# Patient Record
Sex: Male | Born: 1980 | Race: White | Hispanic: No | Marital: Married | Smoking: Never smoker
Health system: Southern US, Community
[De-identification: ages and names within clinical notes are randomized; demographics above are authoritative.]

## PROBLEM LIST (undated history)

## (undated) DIAGNOSIS — R7301 Impaired fasting glucose: Secondary | ICD-10-CM

## (undated) DIAGNOSIS — E8881 Metabolic syndrome: Secondary | ICD-10-CM

## (undated) DIAGNOSIS — E785 Hyperlipidemia, unspecified: Secondary | ICD-10-CM

## (undated) DIAGNOSIS — E668 Other obesity: Secondary | ICD-10-CM

## (undated) DIAGNOSIS — M109 Gout, unspecified: Secondary | ICD-10-CM

## (undated) DIAGNOSIS — E669 Obesity, unspecified: Secondary | ICD-10-CM

## (undated) DIAGNOSIS — I10 Essential (primary) hypertension: Secondary | ICD-10-CM

## (undated) HISTORY — DX: Gout, unspecified: M10.9

## (undated) HISTORY — DX: Metabolic syndrome: E88.810

## (undated) HISTORY — DX: Essential (primary) hypertension: I10

## (undated) HISTORY — DX: Other obesity: E66.8

## (undated) HISTORY — DX: Hyperlipidemia, unspecified: E78.5

## (undated) HISTORY — DX: Obesity, unspecified: E66.9

## (undated) HISTORY — DX: Impaired fasting glucose: R73.01

## (undated) HISTORY — DX: Metabolic syndrome: E88.81

---

## 2004-12-29 ENCOUNTER — Ambulatory Visit: Payer: Self-pay | Admitting: Family Medicine

## 2013-08-21 DIAGNOSIS — Z86010 Personal history of colonic polyps: Secondary | ICD-10-CM | POA: Insufficient documentation

## 2013-10-16 ENCOUNTER — Ambulatory Visit: Payer: Self-pay | Admitting: Unknown Physician Specialty

## 2013-10-20 LAB — PATHOLOGY REPORT

## 2014-09-22 ENCOUNTER — Other Ambulatory Visit: Payer: Self-pay

## 2014-09-22 DIAGNOSIS — I10 Essential (primary) hypertension: Secondary | ICD-10-CM

## 2014-09-22 DIAGNOSIS — Z5181 Encounter for therapeutic drug level monitoring: Secondary | ICD-10-CM

## 2014-09-22 MED ORDER — LISINOPRIL 20 MG PO TABS: 20.0000 mg | ORAL_TABLET | Freq: Every day | ORAL | Status: DC

## 2014-09-22 NOTE — Telephone Encounter (Signed)
Patient was last seen on 03/15/14 and that note said follow-up in 5 months so patient probably needs to be scheduled an appointment. [facility] Partner number is [phone].

## 2014-09-22 NOTE — Telephone Encounter (Signed)
[facility] reviewed; creatinine Dec 2015 1.22, K+ 4.1 Lisinopril was increased from 10 mg to 20 mg that visit I do not see a repeat creatinine or K+; patient did not return for labs and visit in May as directed -------------------- [place] -- remind the patient Brian MaxwellCheryl wanted to see him back in May; get him scheduled to see her as soon as possible Please have the patient come in for a BMP only on Thursday July 7th (so I can get the results back by Friday) I'll approve a few days of medicine, but want to make sure his kidneys are happy with the higher dose of lisinopril I'm not getting all the labs Goshenheryl wanted, just the one necessary to make sure it's safe to continue this pill

## 2014-09-23 ENCOUNTER — Other Ambulatory Visit: Payer: BLUE CROSS/BLUE SHIELD

## 2014-09-23 DIAGNOSIS — I10 Essential (primary) hypertension: Secondary | ICD-10-CM

## 2014-09-23 DIAGNOSIS — Z5181 Encounter for therapeutic drug level monitoring: Secondary | ICD-10-CM

## 2014-09-23 NOTE — Telephone Encounter (Signed)
Called and left patient a voicemail stating that we needed to schedule a visit for him with Elnita Maxwellheryl and a lab visit. I asked for him to return my call.

## 2014-09-23 NOTE — Telephone Encounter (Signed)
Patient returned call and spoke to BuchananStephanie. She scheduled him a lab visit for today and patient stated he would schedule the follow-up visit when he came in for the lab today.

## 2014-09-24 ENCOUNTER — Other Ambulatory Visit: Payer: Self-pay | Admitting: Family Medicine

## 2014-09-24 DIAGNOSIS — I10 Essential (primary) hypertension: Secondary | ICD-10-CM

## 2014-09-24 DIAGNOSIS — E1169 Type 2 diabetes mellitus with other specified complication: Secondary | ICD-10-CM | POA: Insufficient documentation

## 2014-09-24 DIAGNOSIS — I152 Hypertension secondary to endocrine disorders: Secondary | ICD-10-CM | POA: Insufficient documentation

## 2014-09-24 DIAGNOSIS — E1159 Type 2 diabetes mellitus with other circulatory complications: Secondary | ICD-10-CM | POA: Insufficient documentation

## 2014-09-24 DIAGNOSIS — R7301 Impaired fasting glucose: Secondary | ICD-10-CM

## 2014-09-24 DIAGNOSIS — M109 Gout, unspecified: Secondary | ICD-10-CM | POA: Insufficient documentation

## 2014-09-24 DIAGNOSIS — E669 Obesity, unspecified: Secondary | ICD-10-CM | POA: Insufficient documentation

## 2014-09-24 DIAGNOSIS — E8881 Metabolic syndrome: Secondary | ICD-10-CM

## 2014-09-24 DIAGNOSIS — E790 Hyperuricemia without signs of inflammatory arthritis and tophaceous disease: Secondary | ICD-10-CM

## 2014-09-24 DIAGNOSIS — E668 Other obesity: Secondary | ICD-10-CM

## 2014-09-24 DIAGNOSIS — I129 Hypertensive chronic kidney disease with stage 1 through stage 4 chronic kidney disease, or unspecified chronic kidney disease: Secondary | ICD-10-CM

## 2014-09-24 DIAGNOSIS — N181 Chronic kidney disease, stage 1: Secondary | ICD-10-CM

## 2014-09-24 DIAGNOSIS — E119 Type 2 diabetes mellitus without complications: Secondary | ICD-10-CM | POA: Insufficient documentation

## 2014-09-24 DIAGNOSIS — E785 Hyperlipidemia, unspecified: Secondary | ICD-10-CM

## 2014-09-24 DIAGNOSIS — Z5181 Encounter for therapeutic drug level monitoring: Secondary | ICD-10-CM

## 2014-09-24 LAB — BASIC METABOLIC PANEL
BUN / CREAT RATIO: 13 (ref 8–19)
BUN: 14 mg/dL (ref 6–20)
CHLORIDE: 99 mmol/L (ref 97–108)
CO2: 24 mmol/L (ref 18–29)
Calcium: 9.4 mg/dL (ref 8.7–10.2)
Creatinine, Ser: 1.06 mg/dL (ref 0.76–1.27)
GFR calc Af Amer: 106 mL/min/{1.73_m2} (ref >59)
GFR calc non Af Amer: 92 mL/min/{1.73_m2} (ref >59)
Glucose: 113 mg/dL — ABNORMAL HIGH (ref 65–99)
Potassium: 4 mmol/L (ref 3.5–5.2)
SODIUM: 141 mmol/L (ref 134–144)

## 2014-09-24 MED ORDER — LISINOPRIL 20 MG PO TABS: 20.0000 mg | ORAL_TABLET | Freq: Every day | ORAL | Status: DC

## 2014-09-24 NOTE — Progress Notes (Signed)
Rx sent for BP after reviewing the BMP We don't have a pharmacy listed; printed Rx to be manually faxed

## 2014-09-24 NOTE — Progress Notes (Signed)
[facility]

## 2014-10-01 ENCOUNTER — Encounter: Payer: Self-pay | Admitting: Unknown Physician Specialty

## 2014-10-01 ENCOUNTER — Ambulatory Visit (INDEPENDENT_AMBULATORY_CARE_PROVIDER_SITE_OTHER): Payer: BLUE CROSS/BLUE SHIELD | Admitting: Unknown Physician Specialty

## 2014-10-01 VITALS — BP 149/89 | HR 81 | Temp 98.0°F | Ht 73.2 in | Wt 375.6 lb

## 2014-10-01 DIAGNOSIS — N181 Chronic kidney disease, stage 1: Secondary | ICD-10-CM | POA: Diagnosis not present

## 2014-10-01 DIAGNOSIS — E669 Obesity, unspecified: Secondary | ICD-10-CM

## 2014-10-01 DIAGNOSIS — E668 Other obesity: Secondary | ICD-10-CM

## 2014-10-01 DIAGNOSIS — I129 Hypertensive chronic kidney disease with stage 1 through stage 4 chronic kidney disease, or unspecified chronic kidney disease: Secondary | ICD-10-CM

## 2014-10-01 NOTE — Patient Instructions (Signed)

## 2014-10-01 NOTE — Assessment & Plan Note (Addendum)
Pt agrees to stop soft drinks.  Consider sleep study

## 2014-10-01 NOTE — Assessment & Plan Note (Signed)
Elevated today but has been at goal.  Will recheck in December for PE.

## 2014-10-01 NOTE — Progress Notes (Signed)
kj  BP 149/89 mmHg  Pulse 81  Temp(Src) 98 F (36.7 C)  Ht 6' 1.2" (1.859 m)  Wt 375 lb 9.6 oz (170.371 kg)  BMI 49.30 kg/m2  SpO2 97%   Subjective:    Patient ID: Brian Thornton, male    DOB: November 11, 1980, 34 y.o.   MRN: [id number]  HPI: Brian Ravenhillip G Rubinstein is a 34 y.o. male  Chief Complaint  Patient presents with  . Hypertension    Relevant past medical, surgical, family and social history reviewed and updated as indicated. Interim medical history since our last visit reviewed. Allergies and medications reviewed and updated.  Hypertension This is a chronic problem. Progression since onset: Not to goal. Pertinent negatives include no anxiety, chest pain, peripheral edema or shortness of breath. Risk factors for coronary artery disease include obesity. There are no compliance problems.  Hypertensive end-organ damage includes kidney disease.    Gout Per Dr. [facility]  Obesity Severely obese.  Continues to gain weight.  Snores at night.  Doesn't fall asleep in the middle of the day.  Unable to exercise due to work schedule.  States he overeats when he gets home.  Drinks 4-5 soft drinks/day.     Review of Systems  Respiratory: Negative for shortness of breath.   Cardiovascular: Negative for chest pain.  All other systems reviewed and are negative.   Per HPI unless specifically indicated above     Objective:    BP 149/89 mmHg  Pulse 81  Temp(Src) 98 F (36.7 C)  Ht 6' 1.2" (1.859 m)  Wt 375 lb 9.6 oz (170.371 kg)  BMI 49.30 kg/m2  SpO2 97%  Wt Readings from Last 3 Encounters:  10/01/14 375 lb 9.6 oz (170.371 kg)  03/15/14 370 lb (167.831 kg)    Physical Exam  Constitutional: He is oriented to person, place, and time. He appears well-developed and well-nourished. No distress.  HENT:  Head: Normocephalic and atraumatic.  Eyes: Conjunctivae and lids are normal. Right eye exhibits no discharge. Left eye exhibits no discharge. No scleral icterus.  Cardiovascular:  Normal rate and regular rhythm.   Pulmonary/Chest: Effort normal. No respiratory distress.  Abdominal: Normal appearance and bowel sounds are normal. There is no splenomegaly or hepatomegaly.  Musculoskeletal: Normal range of motion.  Neurological: He is alert and oriented to person, place, and time.  Skin: Skin is intact. No rash noted. No pallor.  Psychiatric: He has a normal mood and affect. His behavior is normal. Judgment and thought content normal.  Vitals reviewed.      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  Will  recheck in December for PE.          Other   Extreme obesity    Pt agrees to stop soft drinks.  Consider sleep study          Follow up plan: Return in about 5 months (around 03/03/2015) for Physical in December.  Marland Kitchen

## 2014-11-24 ENCOUNTER — Other Ambulatory Visit: Payer: Self-pay

## 2014-11-24 MED ORDER — INDOMETHACIN 50 MG PO CAPS: 50.0000 mg | ORAL_CAPSULE | Freq: Three times a day (TID) | ORAL | Status: DC | PRN

## 2014-11-24 NOTE — Telephone Encounter (Signed)
Patient was last seen 10/01/14 and [facility]. 

## 2015-01-14 ENCOUNTER — Other Ambulatory Visit: Payer: Self-pay

## 2015-01-14 DIAGNOSIS — I10 Essential (primary) hypertension: Secondary | ICD-10-CM

## 2015-01-14 DIAGNOSIS — Z5181 Encounter for therapeutic drug level monitoring: Secondary | ICD-10-CM

## 2015-01-14 MED ORDER — LISINOPRIL 20 MG PO TABS: 20.0000 mg | ORAL_TABLET | Freq: Every day | ORAL | Status: DC

## 2015-01-14 NOTE — Telephone Encounter (Signed)
LAST VISIT: 10/01/2014 PATIENT HAS UPCOMING APPOINTMENT 02/21/2015  Request for lisinorpil 20 mg tab.  Dr. Sherie DonLada wrote the Rx 09/24/2014 #30 with 1 refill.

## 2015-01-17 ENCOUNTER — Other Ambulatory Visit: Payer: Self-pay | Admitting: Unknown Physician Specialty

## 2015-01-17 ENCOUNTER — Other Ambulatory Visit: Payer: Self-pay | Admitting: Family Medicine

## 2015-02-21 ENCOUNTER — Encounter: Payer: Self-pay | Admitting: Unknown Physician Specialty

## 2015-02-21 ENCOUNTER — Ambulatory Visit (INDEPENDENT_AMBULATORY_CARE_PROVIDER_SITE_OTHER): Payer: BLUE CROSS/BLUE SHIELD | Admitting: Unknown Physician Specialty

## 2015-02-21 VITALS — BP 134/84 | HR 73 | Temp 98.6°F | Ht 72.9 in | Wt 363.4 lb

## 2015-02-21 DIAGNOSIS — M109 Gout, unspecified: Secondary | ICD-10-CM

## 2015-02-21 DIAGNOSIS — Z5181 Encounter for therapeutic drug level monitoring: Secondary | ICD-10-CM

## 2015-02-21 DIAGNOSIS — R7301 Impaired fasting glucose: Secondary | ICD-10-CM | POA: Diagnosis not present

## 2015-02-21 DIAGNOSIS — I10 Essential (primary) hypertension: Secondary | ICD-10-CM

## 2015-02-21 DIAGNOSIS — Z0000 Encounter for general adult medical examination without abnormal findings: Secondary | ICD-10-CM | POA: Diagnosis not present

## 2015-02-21 DIAGNOSIS — Z800 Family history of malignant neoplasm of digestive organs: Secondary | ICD-10-CM | POA: Diagnosis not present

## 2015-02-21 LAB — BAYER DCA HB A1C WAIVED: HB A1C: 5.5 % (ref <7.0)

## 2015-02-21 LAB — MICROALBUMIN, URINE WAIVED
Creatinine, Urine Waived: 200 mg/dL (ref 10–300)
Microalb, Ur Waived: 80 mg/L — ABNORMAL HIGH (ref 0–19)
Microalb/Creat Ratio: <30 mg/g (ref <30)

## 2015-02-21 MED ORDER — LISINOPRIL 20 MG PO TABS: 20.0000 mg | ORAL_TABLET | Freq: Every day | ORAL | Status: DC

## 2015-02-21 NOTE — Assessment & Plan Note (Signed)
Lost 12 pounds by stopping soft drinks

## 2015-02-21 NOTE — Progress Notes (Signed)
BP 134/84 mmHg  Pulse 73  Temp(Src) 98.6 F (37 C)  Ht 6' 0.9" (1.852 m)  Wt 363 lb 6.4 oz (164.837 kg)  BMI 48.06 kg/m2  SpO2 97%   Subjective:    Patient ID: Brian Thornton, male    DOB: Sep 19, 1980, 34 y.o.   MRN: [id number]  HPI: Brian Ravenhillip G Phegley is a 34 y.o. male  Chief Complaint  Patient presents with  . Annual Exam   Hypertension Using medications without difficulty Average home BPs not checking   No problems or lightheadedness No chest pain with exertion or shortness of breath No Edema  Sleep apnea Brother has sleep apnea.  Pt snores loudly at night.  Wakes us feeling rested.  Does not wake up with a headache.  Does fall asleep during the day at times.   Relevant past medical, surgical, family and social history reviewed and updated as indicated. Interim medical history since our last visit reviewed. Allergies and medications reviewed and updated.  Review of Systems  Constitutional: Negative.   HENT: Negative.   Eyes: Negative.   Respiratory: Negative.   Cardiovascular: Negative.   Gastrointestinal: Negative.   Endocrine: Negative.   Genitourinary: Negative.   Skin: Negative.   Allergic/Immunologic: Negative.   Neurological: Negative.   Hematological: Negative.   Psychiatric/Behavioral: Negative.     Per HPI unless specifically indicated above     Objective:    BP 134/84 mmHg  Pulse 73  Temp(Src) 98.6 F (37 C)  Ht 6' 0.9" (1.852 m)  Wt 363 lb 6.4 oz (164.837 kg)  BMI 48.06 kg/m2  SpO2 97%  Wt Readings from Last 3 Encounters:  02/21/15 363 lb 6.4 oz (164.837 kg)  10/01/14 375 lb 9.6 oz (170.371 kg)  03/15/14 370 lb (167.831 kg)    Physical Exam  Constitutional: He is oriented to person, place, and time. He appears well-developed and well-nourished.  HENT:  Head: Normocephalic.  Eyes: Pupils are equal, round, and reactive to light.  Cardiovascular: Normal rate, regular rhythm and normal heart sounds.   Pulmonary/Chest: Effort normal.   Abdominal: Soft. Bowel sounds are normal.  Musculoskeletal: Normal range of motion.  Neurological: He is alert and oriented to person, place, and time. He has normal reflexes.  Skin: Skin is warm and dry.  Psychiatric: He has a normal mood and affect. His behavior is normal. Judgment and thought content normal.     Assessment & Plan:   Problem List Items Addressed This Visit      Unprioritized   Hypertension   Relevant Medications   lisinopril (PRINIVIL,ZESTRIL) 20 MG tablet   Other Relevant Orders   Lipid Panel w/o Chol/HDL Ratio   Microalbumin, Urine Waived   Uric acid   Gout   Relevant Orders   Uric acid   IFG (impaired fasting glucose)   Relevant Orders   Bayer DCA Hb A1c Waived   Extreme obesity (HCC)    Lost 12 pounds by stopping soft drinks      Relevant Orders   Lipid Panel w/o Chol/HDL Ratio   Ambulatory referral to Sleep Studies   Family history of colon cancer - Primary    Other Visit Diagnoses    Routine general medical examination at a health care facility        Relevant Orders    HIV antibody    Comprehensive metabolic panel    CBC    TSH    Lipid Panel w/o Chol/HDL Ratio    Medication monitoring encounter  Relevant Medications    lisinopril (PRINIVIL,ZESTRIL) 20 MG tablet        Follow up plan: Return in about 6 months (around 08/22/2015).

## 2015-02-22 ENCOUNTER — Encounter: Payer: Self-pay | Admitting: Unknown Physician Specialty

## 2015-02-22 LAB — COMPREHENSIVE METABOLIC PANEL
A/G RATIO: 1.7 (ref 1.1–2.5)
ALBUMIN: 4.2 g/dL (ref 3.5–5.5)
ALT: 34 IU/L (ref 0–44)
AST: 23 IU/L (ref 0–40)
Alkaline Phosphatase: 90 IU/L (ref 39–117)
BUN/Creatinine Ratio: 10 (ref 8–19)
BUN: 11 mg/dL (ref 6–20)
Bilirubin Total: 0.3 mg/dL (ref 0.0–1.2)
CALCIUM: 9.5 mg/dL (ref 8.7–10.2)
CO2: 24 mmol/L (ref 18–29)
Chloride: 103 mmol/L (ref 97–106)
Creatinine, Ser: 1.09 mg/dL (ref 0.76–1.27)
GFR, EST AFRICAN AMERICAN: 103 mL/min/{1.73_m2} (ref >59)
GFR, EST NON AFRICAN AMERICAN: 89 mL/min/{1.73_m2} (ref >59)
Globulin, Total: 2.5 g/dL (ref 1.5–4.5)
Glucose: 104 mg/dL — ABNORMAL HIGH (ref 65–99)
POTASSIUM: 4.3 mmol/L (ref 3.5–5.2)
Sodium: 142 mmol/L (ref 136–144)
TOTAL PROTEIN: 6.7 g/dL (ref 6.0–8.5)

## 2015-02-22 LAB — TSH: TSH: 1.48 u[IU]/mL (ref 0.450–4.500)

## 2015-02-22 LAB — CBC
HEMATOCRIT: 44.8 % (ref 37.5–51.0)
HEMOGLOBIN: 15.3 g/dL (ref 12.6–17.7)
MCH: 29 pg (ref 26.6–33.0)
MCHC: 34.2 g/dL (ref 31.5–35.7)
MCV: 85 fL (ref 79–97)
Platelets: 215 10*3/uL (ref 150–379)
RBC: 5.27 x10E6/uL (ref 4.14–5.80)
RDW: 14.6 % (ref 12.3–15.4)
WBC: 8.7 10*3/uL (ref 3.4–10.8)

## 2015-02-22 LAB — LIPID PANEL W/O CHOL/HDL RATIO
Cholesterol, Total: 171 mg/dL (ref 100–199)
HDL: 30 mg/dL — ABNORMAL LOW (ref >39)
LDL CALC: 112 mg/dL — AB (ref 0–99)
Triglycerides: 143 mg/dL (ref 0–149)
VLDL CHOLESTEROL CAL: 29 mg/dL (ref 5–40)

## 2015-02-22 LAB — URIC ACID: Uric Acid: 7.1 mg/dL (ref 3.7–8.6)

## 2015-02-22 LAB — HIV ANTIBODY (ROUTINE TESTING W REFLEX): HIV SCREEN 4TH GENERATION: NONREACTIVE

## 2015-02-22 NOTE — Progress Notes (Signed)
Quick Note:  Normal labs. Patient notified by letter. ______ 

## 2015-04-07 ENCOUNTER — Other Ambulatory Visit: Payer: Self-pay | Admitting: Family Medicine

## 2015-05-10 ENCOUNTER — Other Ambulatory Visit: Payer: Self-pay | Admitting: Unknown Physician Specialty

## 2015-08-19 ENCOUNTER — Other Ambulatory Visit: Payer: Self-pay | Admitting: Unknown Physician Specialty

## 2015-09-06 ENCOUNTER — Telehealth: Payer: Self-pay | Admitting: Unknown Physician Specialty

## 2015-09-06 NOTE — Telephone Encounter (Signed)
Pt called and stated that he has had a headache for about 3 days. He has an appt scheduled for 09/07/2015 but would like to know if he should take more BP meds or if there is something he can take to help.

## 2015-09-06 NOTE — Telephone Encounter (Signed)
Called and gave patient Brian Thornton's instructions.  

## 2015-09-06 NOTE — Telephone Encounter (Signed)
Called and left patient a voicemail asking for him to please return my call.  

## 2015-09-06 NOTE — Telephone Encounter (Signed)
Called and spoke to patient. He stated that he has tried taking aleve and some allergy medication to see if they would help and they didn't. I asked the patient if he had checked his BP at all during the headache and he stated that it was 148/89 last night.

## 2015-09-06 NOTE — Telephone Encounter (Signed)
I think I would need to see him.  If the headache is severe, or any neuological changes he should go to the ER.  It will be OK to double his BP medication.

## 2015-09-07 ENCOUNTER — Ambulatory Visit (INDEPENDENT_AMBULATORY_CARE_PROVIDER_SITE_OTHER): Payer: Self-pay | Admitting: Unknown Physician Specialty

## 2015-09-07 ENCOUNTER — Encounter: Payer: Self-pay | Admitting: Unknown Physician Specialty

## 2015-09-07 VITALS — BP 121/77 | HR 74 | Temp 98.2°F | Ht 73.1 in | Wt 376.0 lb

## 2015-09-07 DIAGNOSIS — G4730 Sleep apnea, unspecified: Secondary | ICD-10-CM

## 2015-09-07 DIAGNOSIS — I10 Essential (primary) hypertension: Secondary | ICD-10-CM

## 2015-09-07 MED ORDER — LISINOPRIL 40 MG PO TABS: 40.0000 mg | ORAL_TABLET | Freq: Every day | ORAL | Status: DC

## 2015-09-07 NOTE — Assessment & Plan Note (Signed)
Suspect headache is related to sleep apnea.  He is uninsured but really needs sleep study.

## 2015-09-07 NOTE — Progress Notes (Signed)
BP 121/77 mmHg  Pulse 74  Temp(Src) 98.2 F (36.8 C)  Ht 6' 1.1" (1.857 m)  Wt 376 lb (170.552 kg)  BMI 49.46 kg/m2  SpO2 96%   Subjective:    Patient ID: Brian Thornton, male    DOB: [birth year], 35 y.o.   MRN: [id number]  HPI: Brian Thornton is a 35 y.o. male  Chief Complaint  Patient presents with  . Hypertension    pt states his BP has been elevated, did take 2 lisinopril tablets this morning  . Headache    pt states he has had a headache for about 4 days now, states it is better now than it has been    Headache Pt has a lingering headache across his forehead, across his temples and down his neck.  He took an Exedrine today which helped.  His BP 2 days ago was 146-148/89.  No trouble speaking or problems with arm, legs, feet, or hands.  His headache was really bad at the time.  He denies additional stress.  He snores at night and has apneic episodes.    Relevant past medical, surgical, family and social history reviewed and updated as indicated. Interim medical history since our last visit reviewed. Allergies and medications reviewed and updated.  Review of Systems  Per HPI unless specifically indicated above     Objective:    BP 121/77 mmHg  Pulse 74  Temp(Src) 98.2 F (36.8 C)  Ht 6' 1.1" (1.857 m)  Wt 376 lb (170.552 kg)  BMI 49.46 kg/m2  SpO2 96%  Wt Readings from Last 3 Encounters:  09/07/15 376 lb (170.552 kg)  02/21/15 363 lb 6.4 oz (164.837 kg)  10/01/14 375 lb 9.6 oz (170.371 kg)    Physical Exam  Constitutional: He is oriented to person, place, and time. He appears well-developed and well-nourished. No distress.  HENT:  Head: Normocephalic and atraumatic.  Eyes: Conjunctivae and lids are normal. Right eye exhibits no discharge. Left eye exhibits no discharge. No scleral icterus.  Neck: Normal range of motion. Neck supple. No JVD present. Carotid bruit is not present.  Cardiovascular: Normal rate, regular rhythm and normal heart sounds.    Pulmonary/Chest: Effort normal and breath sounds normal. No respiratory distress.  Abdominal: Normal appearance. There is no splenomegaly or hepatomegaly.  Musculoskeletal: Normal range of motion.  Neurological: He is alert and oriented to person, place, and time.  Skin: Skin is warm, dry and intact. No rash noted. No pallor.  Psychiatric: He has a normal mood and affect. His behavior is normal. Judgment and thought content normal.    Results for orders placed or performed in visit on 02/21/15  HIV antibody  Result Value Ref Range   HIV Screen 4th Generation wRfx Non Reactive Non Reactive  Comprehensive metabolic panel  Result Value Ref Range   Glucose 104 (H) 65 - 99 mg/dL   BUN 11 6 - 20 mg/dL   Creatinine, Ser 0.86 0.76 - 1.27 mg/dL   GFR calc non Af Amer 89 >59 mL/min/1.73   GFR calc Af Amer 103 >59 mL/min/1.73   BUN/Creatinine Ratio 10 8 - 19   Sodium 142 136 - 144 mmol/L   Potassium 4.3 3.5 - 5.2 mmol/L   Chloride 103 97 - 106 mmol/L   CO2 24 18 - 29 mmol/L   Calcium 9.5 8.7 - 10.2 mg/dL   Total Protein 6.7 6.0 - 8.5 g/dL   Albumin 4.2 3.5 - 5.5 g/dL   Globulin, Total  2.5 1.5 - 4.5 g/dL   Albumin/Globulin Ratio 1.7 1.1 - 2.5   Bilirubin Total 0.3 0.0 - 1.2 mg/dL   Alkaline Phosphatase 90 39 - 117 IU/L   AST 23 0 - 40 IU/L   ALT 34 0 - 44 IU/L  CBC  Result Value Ref Range   WBC 8.7 3.4 - 10.8 x10E3/uL   RBC 5.27 4.14 - 5.80 x10E6/uL   Hemoglobin 15.3 12.6 - 17.7 g/dL   Hematocrit 78.244.8 95.637.5 - 51.0 %   MCV 85 79 - 97 fL   MCH 29.0 26.6 - 33.0 pg   MCHC 34.2 31.5 - 35.7 g/dL   RDW 21.314.6 08.612.3 - 57.815.4 %   Platelets 215 150 - 379 x10E3/uL  TSH  Result Value Ref Range   TSH 1.480 0.450 - 4.500 uIU/mL  Lipid Panel w/o Chol/HDL Ratio  Result Value Ref Range   Cholesterol, Total 171 100 - 199 mg/dL   Triglycerides 469143 0 - 149 mg/dL   HDL 30 (L) >62>39 mg/dL   VLDL Cholesterol Cal 29 5 - 40 mg/dL   LDL Calculated 952112 (H) 0 - 99 mg/dL  Microalbumin, Urine Waived  Result  Value Ref Range   Microalb, Ur Waived 80 (H) 0 - 19 mg/L   Creatinine, Urine Waived 200 10 - 300 mg/dL   Microalb/Creat Ratio <30 <30 mg/g  Uric acid  Result Value Ref Range   Uric Acid 7.1 3.7 - 8.6 mg/dL  Bayer DCA Hb W4XA1c Waived  Result Value Ref Range   Bayer DCA Hb A1c Waived 5.5 <7.0 %      Assessment & Plan:   Problem List Items Addressed This Visit      Unprioritized   Extreme obesity (HCC)    Had lost weight and now gained it back      Hypertension - Primary    Normotensive today.  Took 40 mg Lisinopril today.  Continue with this dose      Relevant Medications   lisinopril (PRINIVIL,ZESTRIL) 40 MG tablet   Sleep apnea    Suspect headache is related to sleep apnea.  He is uninsured but really needs sleep study.        Relevant Orders   Ambulatory referral to Sleep Studies       Follow up plan: Return in about 6 weeks (around 10/19/2015).

## 2015-09-07 NOTE — Assessment & Plan Note (Addendum)
Normotensive today.  Took 40 mg Lisinopril today.  Continue with this dose

## 2015-09-07 NOTE — Assessment & Plan Note (Signed)
Had lost weight and now gained it back

## 2015-10-19 ENCOUNTER — Ambulatory Visit (INDEPENDENT_AMBULATORY_CARE_PROVIDER_SITE_OTHER): Payer: Self-pay | Admitting: Unknown Physician Specialty

## 2015-10-19 ENCOUNTER — Encounter: Payer: Self-pay | Admitting: Unknown Physician Specialty

## 2015-10-19 VITALS — BP 132/82 | HR 75 | Temp 98.3°F | Ht 72.6 in | Wt 384.8 lb

## 2015-10-19 DIAGNOSIS — I10 Essential (primary) hypertension: Secondary | ICD-10-CM

## 2015-10-19 DIAGNOSIS — M109 Gout, unspecified: Secondary | ICD-10-CM

## 2015-10-19 DIAGNOSIS — R7301 Impaired fasting glucose: Secondary | ICD-10-CM

## 2015-10-19 DIAGNOSIS — E8881 Metabolic syndrome: Secondary | ICD-10-CM

## 2015-10-19 LAB — BAYER DCA HB A1C WAIVED: HB A1C (BAYER DCA - WAIVED): 6.4 % (ref <7.0)

## 2015-10-19 NOTE — Assessment & Plan Note (Signed)
Discussed cutting out all sugar drinks

## 2015-10-19 NOTE — Assessment & Plan Note (Signed)
Stable, continue present medications.   

## 2015-10-19 NOTE — Assessment & Plan Note (Signed)
Hgb A1C is 6.4.  Pt ed on diet

## 2015-10-19 NOTE — Progress Notes (Signed)
BP 132/82 (BP Location: Left Arm, Patient Position: Sitting, Cuff Size: Large)   Pulse 75   Temp 98.3 F (36.8 C)   Ht 6' 0.6" (1.844 m) Comment: pt had boots on  Wt (!) 384 lb 12.8 oz (174.5 kg) Comment: pt had boots on  SpO2 98%   BMI 51.33 kg/m    Subjective:    Patient ID: Brian Thornton, male    DOB: October 22, 1980, 35 y.o.   MRN: [id number]  HPI: Brian Thornton is a 35 y.o. male  Chief Complaint  Patient presents with  . Hypertension   Hypertension Using medications without difficulty Average home BPs   No problems or lightheadedness No chest pain with exertion or shortness of breath No Edema  Obesity "I haven't done much" Awaiting sleep apnea tests.    Relevant past medical, surgical, family and social history reviewed and updated as indicated. Interim medical history since our last visit reviewed. Allergies and medications reviewed and updated.  Review of Systems  Per HPI unless specifically indicated above     Objective:    BP 132/82 (BP Location: Left Arm, Patient Position: Sitting, Cuff Size: Large)   Pulse 75   Temp 98.3 F (36.8 C)   Ht 6' 0.6" (1.844 m) Comment: pt had boots on  Wt (!) 384 lb 12.8 oz (174.5 kg) Comment: pt had boots on  SpO2 98%   BMI 51.33 kg/m   Wt Readings from Last 3 Encounters:  10/19/15 (!) 384 lb 12.8 oz (174.5 kg)  09/07/15 (!) 376 lb (170.6 kg)  02/21/15 (!) 363 lb 6.4 oz (164.8 kg)    Physical Exam  Constitutional: He is oriented to person, place, and time. He appears well-developed and well-nourished. No distress.  HENT:  Head: Normocephalic and atraumatic.  Eyes: Conjunctivae and lids are normal. Right eye exhibits no discharge. Left eye exhibits no discharge. No scleral icterus.  Neck: Normal range of motion. Neck supple. No JVD present. Carotid bruit is not present.  Cardiovascular: Normal rate, regular rhythm and normal heart sounds.   Pulmonary/Chest: Effort normal and breath sounds normal. No respiratory  distress.  Abdominal: Normal appearance. There is no splenomegaly or hepatomegaly.  Musculoskeletal: Normal range of motion.  Neurological: He is alert and oriented to person, place, and time.  Skin: Skin is warm, dry and intact. No rash noted. No pallor.  Psychiatric: He has a normal mood and affect. His behavior is normal. Judgment and thought content normal.    Results for orders placed or performed in visit on 02/21/15  HIV antibody  Result Value Ref Range   HIV Screen 4th Generation wRfx Non Reactive Non Reactive  Comprehensive metabolic panel  Result Value Ref Range   Glucose 104 (H) 65 - 99 mg/dL   BUN 11 6 - 20 mg/dL   Creatinine, Ser 5.17 0.76 - 1.27 mg/dL   GFR calc non Af Amer 89 >59 mL/min/1.73   GFR calc Af Amer 103 >59 mL/min/1.73   BUN/Creatinine Ratio 10 8 - 19   Sodium 142 136 - 144 mmol/L   Potassium 4.3 3.5 - 5.2 mmol/L   Chloride 103 97 - 106 mmol/L   CO2 24 18 - 29 mmol/L   Calcium 9.5 8.7 - 10.2 mg/dL   Total Protein 6.7 6.0 - 8.5 g/dL   Albumin 4.2 3.5 - 5.5 g/dL   Globulin, Total 2.5 1.5 - 4.5 g/dL   Albumin/Globulin Ratio 1.7 1.1 - 2.5   Bilirubin Total 0.3 0.0 -  1.2 mg/dL   Alkaline Phosphatase 90 39 - 117 IU/L   AST 23 0 - 40 IU/L   ALT 34 0 - 44 IU/L  CBC  Result Value Ref Range   WBC 8.7 3.4 - 10.8 x10E3/uL   RBC 5.27 4.14 - 5.80 x10E6/uL   Hemoglobin 15.3 12.6 - 17.7 g/dL   Hematocrit 16.1 09.6 - 51.0 %   MCV 85 79 - 97 fL   MCH 29.0 26.6 - 33.0 pg   MCHC 34.2 31.5 - 35.7 g/dL   RDW 04.5 40.9 - 81.1 %   Platelets 215 150 - 379 x10E3/uL  TSH  Result Value Ref Range   TSH 1.480 0.450 - 4.500 uIU/mL  Lipid Panel w/o Chol/HDL Ratio  Result Value Ref Range   Cholesterol, Total 171 100 - 199 mg/dL   Triglycerides 914 0 - 149 mg/dL   HDL 30 (L) >78 mg/dL   VLDL Cholesterol Cal 29 5 - 40 mg/dL   LDL Calculated 295 (H) 0 - 99 mg/dL  Microalbumin, Urine Waived  Result Value Ref Range   Microalb, Ur Waived 80 (H) 0 - 19 mg/L   Creatinine,  Urine Waived 200 10 - 300 mg/dL   Microalb/Creat Ratio <30 <30 mg/g  Uric acid  Result Value Ref Range   Uric Acid 7.1 3.7 - 8.6 mg/dL  Bayer DCA Hb A2Z Waived  Result Value Ref Range   Bayer DCA Hb A1c Waived 5.5 <7.0 %      Assessment & Plan:   Problem List Items Addressed This Visit      Unprioritized   Dysmetabolic syndrome    Hgb A1C is 6.4.  Pt ed on diet      Extreme obesity (HCC)    Discussed cutting out all sugar drinks      Gout   Relevant Orders   Uric acid   Hypertension    Stable, continue present medications.        Relevant Orders   Comprehensive metabolic panel   IFG (impaired fasting glucose)   Relevant Orders   Comprehensive metabolic panel   Bayer DCA Hb H0Q Waived   Metabolic syndrome - Primary    Other Visit Diagnoses   None.      Follow up plan: Return in about 6 months (around 04/20/2016).

## 2015-10-20 LAB — COMPREHENSIVE METABOLIC PANEL
A/G RATIO: 1.7 (ref 1.2–2.2)
ALK PHOS: 86 IU/L (ref 39–117)
ALT: 31 IU/L (ref 0–44)
AST: 25 IU/L (ref 0–40)
Albumin: 4.1 g/dL (ref 3.5–5.5)
BUN/Creatinine Ratio: 13 (ref 9–20)
BUN: 15 mg/dL (ref 6–20)
Bilirubin Total: 0.3 mg/dL (ref 0.0–1.2)
CHLORIDE: 103 mmol/L (ref 96–106)
CO2: 28 mmol/L (ref 18–29)
Calcium: 9.6 mg/dL (ref 8.7–10.2)
Creatinine, Ser: 1.17 mg/dL (ref 0.76–1.27)
GFR calc Af Amer: 93 mL/min/{1.73_m2} (ref >59)
GFR calc non Af Amer: 81 mL/min/{1.73_m2} (ref >59)
GLOBULIN, TOTAL: 2.4 g/dL (ref 1.5–4.5)
Glucose: 99 mg/dL (ref 65–99)
POTASSIUM: 4.4 mmol/L (ref 3.5–5.2)
SODIUM: 144 mmol/L (ref 134–144)
Total Protein: 6.5 g/dL (ref 6.0–8.5)

## 2015-10-20 LAB — URIC ACID: Uric Acid: 6.6 mg/dL (ref 3.7–8.6)

## 2015-10-21 ENCOUNTER — Encounter: Payer: Self-pay | Admitting: Unknown Physician Specialty

## 2015-12-09 ENCOUNTER — Other Ambulatory Visit: Payer: Self-pay | Admitting: Unknown Physician Specialty

## 2016-01-06 NOTE — Telephone Encounter (Signed)
Your patient 

## 2016-01-08 ENCOUNTER — Other Ambulatory Visit: Payer: Self-pay | Admitting: Unknown Physician Specialty

## 2016-02-08 ENCOUNTER — Other Ambulatory Visit: Payer: Self-pay

## 2016-02-08 MED ORDER — LISINOPRIL 40 MG PO TABS: 40.0000 mg | ORAL_TABLET | Freq: Every day | ORAL | 0 refills | Status: DC

## 2016-04-13 ENCOUNTER — Other Ambulatory Visit: Payer: Self-pay | Admitting: Unknown Physician Specialty

## 2016-04-20 ENCOUNTER — Ambulatory Visit: Payer: Self-pay | Admitting: Unknown Physician Specialty

## 2016-04-25 ENCOUNTER — Encounter: Payer: Self-pay | Admitting: Unknown Physician Specialty

## 2016-04-25 ENCOUNTER — Ambulatory Visit (INDEPENDENT_AMBULATORY_CARE_PROVIDER_SITE_OTHER): Payer: Self-pay | Admitting: Unknown Physician Specialty

## 2016-04-25 DIAGNOSIS — G4730 Sleep apnea, unspecified: Secondary | ICD-10-CM

## 2016-04-25 DIAGNOSIS — N181 Chronic kidney disease, stage 1: Secondary | ICD-10-CM

## 2016-04-25 DIAGNOSIS — I129 Hypertensive chronic kidney disease with stage 1 through stage 4 chronic kidney disease, or unspecified chronic kidney disease: Secondary | ICD-10-CM

## 2016-04-25 MED ORDER — LISINOPRIL 20 MG PO TABS: 20.0000 mg | ORAL_TABLET | Freq: Every day | ORAL | 0 refills | Status: DC

## 2016-04-25 NOTE — Assessment & Plan Note (Addendum)
Sleeping better with weight loss.  Never could get scheduled for sleep study

## 2016-04-25 NOTE — Progress Notes (Signed)
   BP 117/72 (BP Location: Left Arm, Patient Position: Sitting, Cuff Size: Large)   Pulse 64   Temp 97.3 F (36.3 C)   Ht 6\' 2"  (1.88 m)   Wt (!) 373 lb 12.8 oz (169.6 kg)   SpO2 97%   BMI 47.99 kg/m    Subjective:    Patient ID: Brian Thornton, male    DOB: 09/03/1980, 36 y.o.   MRN: [id number]  HPI: Brian Thornton is a 36 y.o. male  Chief Complaint  Patient presents with  . Hypertension  . Gout   Hypertension Using medications without difficulty Average home BPs   No problems or lightheadedness No chest pain with exertion or shortness of breath No Edema  Gout No flares.  Did labs through rheumatology but had high Uric Acid levels.    Relevant past medical, surgical, family and social history reviewed and updated as indicated. Interim medical history since our last visit reviewed. Allergies and medications reviewed and updated.  Review of Systems  Per HPI unless specifically indicated above     Objective:    BP 117/72 (BP Location: Left Arm, Patient Position: Sitting, Cuff Size: Large)   Pulse 64   Temp 97.3 F (36.3 C)   Ht 6\' 2"  (1.88 m)   Wt (!) 373 lb 12.8 oz (169.6 kg)   SpO2 97%   BMI 47.99 kg/m   Wt Readings from Last 3 Encounters:  04/25/16 (!) 373 lb 12.8 oz (169.6 kg)  10/19/15 (!) 384 lb 12.8 oz (174.5 kg)  09/07/15 (!) 376 lb (170.6 kg)    Physical Exam  Constitutional: He is oriented to person, place, and time. He appears well-developed and well-nourished. No distress.  HENT:  Head: Normocephalic and atraumatic.  Eyes: Conjunctivae and lids are normal. Right eye exhibits no discharge. Left eye exhibits no discharge. No scleral icterus.  Neck: Normal range of motion. Neck supple. No JVD present. Carotid bruit is not present.  Cardiovascular: Normal rate, regular rhythm and normal heart sounds.   Pulmonary/Chest: Effort normal and breath sounds normal. No respiratory distress.  Abdominal: Normal appearance. There is no splenomegaly or  hepatomegaly.  Musculoskeletal: Normal range of motion.  Neurological: He is alert and oriented to person, place, and time.  Skin: Skin is warm, dry and intact. No rash noted. No pallor.  Psychiatric: He has a normal mood and affect. His behavior is normal. Judgment and thought content normal.      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  Reduce Lisinopril from 40 ot 20 mg.        Relevant Medications   lisinopril (PRINIVIL,ZESTRIL) 20 MG tablet   Sleep apnea    Sleeping better with weight loss.  Never could get scheduled for sleep study          Follow up plan: Return in about 3 months (around 07/23/2016) for physical.

## 2016-04-25 NOTE — Assessment & Plan Note (Signed)
Excellent BP with good weight loss.  Reduce Lisinopril from 40 ot 20 mg.

## 2016-06-19 ENCOUNTER — Other Ambulatory Visit: Payer: Self-pay | Admitting: Unknown Physician Specialty

## 2016-06-19 NOTE — Telephone Encounter (Signed)
Routing to provider  

## 2016-07-09 ENCOUNTER — Ambulatory Visit (INDEPENDENT_AMBULATORY_CARE_PROVIDER_SITE_OTHER): Payer: Self-pay | Admitting: Unknown Physician Specialty

## 2016-07-09 ENCOUNTER — Encounter: Payer: Self-pay | Admitting: Unknown Physician Specialty

## 2016-07-09 VITALS — BP 136/84 | HR 64 | Temp 98.0°F | Wt 335.2 lb

## 2016-07-09 DIAGNOSIS — L308 Other specified dermatitis: Secondary | ICD-10-CM

## 2016-07-09 DIAGNOSIS — I10 Essential (primary) hypertension: Secondary | ICD-10-CM

## 2016-07-09 DIAGNOSIS — M7989 Other specified soft tissue disorders: Secondary | ICD-10-CM

## 2016-07-09 DIAGNOSIS — L309 Dermatitis, unspecified: Secondary | ICD-10-CM | POA: Insufficient documentation

## 2016-07-09 NOTE — Assessment & Plan Note (Signed)
This is improved

## 2016-07-09 NOTE — Progress Notes (Signed)
BP 136/84   Pulse 64   Temp 98 F (36.7 C)   Wt (!) 335 lb 3.2 oz (152 kg)   SpO2 99%   BMI 43.04 kg/m    Subjective:    Patient ID: Brian Thornton, male    DOB: 09/05/1980, 36 y.o.   MRN: [id number]  HPI: Brian Thornton is a 36 y.o. male  Chief Complaint  Patient presents with  . Leg Swelling    pt states that his right calf has been swelling for a little while, does not know when it actually started but states it is a little tender to the touch  . Rash    pt states he has a rash on the inside of his forearms and calves, states he has been told in the past that it was a heat rash    Right calf swelling Initially noted by wife  States mild tenderness.  No aggravating or alleviating factors.  Does not sit for prolonged periods of time.  No DOE, chest pain, or mental status changes.    Rash Areas noted as above.  No itching.    Keto diet Following a keto diet and is losing weight.     Hypertension Noting weight loss and this is doing better.  No SOB or chest pain   Relevant past medical, surgical, family and social history reviewed and updated as indicated. Interim medical history since our last visit reviewed. Allergies and medications reviewed and updated.  Review of Systems  Per HPI unless specifically indicated above     Objective:    BP 136/84   Pulse 64   Temp 98 F (36.7 C)   Wt (!) 335 lb 3.2 oz (152 kg)   SpO2 99%   BMI 43.04 kg/m   Wt Readings from Last 3 Encounters:  07/09/16 (!) 335 lb 3.2 oz (152 kg)  04/25/16 (!) 373 lb 12.8 oz (169.6 kg)  10/19/15 (!) 384 lb 12.8 oz (174.5 kg)    Physical Exam  Constitutional: He is oriented to person, place, and time. He appears well-developed and well-nourished. No distress.  HENT:  Head: Normocephalic and atraumatic.  Eyes: Conjunctivae and lids are normal. Right eye exhibits no discharge. Left eye exhibits no discharge. No scleral icterus.  Neck: Normal range of motion. Neck supple. No JVD  present. Carotid bruit is not present.  Cardiovascular: Normal rate, regular rhythm and normal heart sounds.   Pulmonary/Chest: Effort normal and breath sounds normal. No respiratory distress.  Abdominal: Normal appearance. There is no splenomegaly or hepatomegaly.  Musculoskeletal: Normal range of motion.       Right lower leg: He exhibits swelling. He exhibits no tenderness, no bony tenderness, no edema, no deformity and no laceration.  Right leg 54 cms and left 48.5 cms    Neurological: He is alert and oriented to person, place, and time.  Skin: Skin is warm, dry and intact. No rash noted. No pallor.  Pt with rash inner forearms and on legs.  Dry, scaley and raised  Psychiatric: He has a normal mood and affect. His behavior is normal. Judgment and thought content normal.    Results for orders placed or performed in visit on 10/19/15  Comprehensive metabolic panel  Result Value Ref Range   Glucose 99 65 - 99 mg/dL   BUN 15 6 - 20 mg/dL   Creatinine, Ser 7.82 0.76 - 1.27 mg/dL   GFR calc non Af Amer 81 >59 mL/min/1.73   GFR calc  Af Amer 93 >59 mL/min/1.73   BUN/Creatinine Ratio 13 9 - 20   Sodium 144 134 - 144 mmol/L   Potassium 4.4 3.5 - 5.2 mmol/L   Chloride 103 96 - 106 mmol/L   CO2 28 18 - 29 mmol/L   Calcium 9.6 8.7 - 10.2 mg/dL   Total Protein 6.5 6.0 - 8.5 g/dL   Albumin 4.1 3.5 - 5.5 g/dL   Globulin, Total 2.4 1.5 - 4.5 g/dL   Albumin/Globulin Ratio 1.7 1.2 - 2.2   Bilirubin Total 0.3 0.0 - 1.2 mg/dL   Alkaline Phosphatase 86 39 - 117 IU/L   AST 25 0 - 40 IU/L   ALT 31 0 - 44 IU/L  Uric acid  Result Value Ref Range   Uric Acid 6.6 3.7 - 8.6 mg/dL  Bayer DCA Hb Z6X Waived  Result Value Ref Range   Bayer DCA Hb A1c Waived 6.4 <7.0 %      Assessment & Plan:   Problem List Items Addressed This Visit      Unprioritized   Eczema    Discussed self care.  He plans to use an OTC product      Hypertension    This is improved       Other Visit Diagnoses    Calf  swelling    -  Primary   Right leg 54 cms and left 48.5 cms.     Relevant Orders   VAS [place] LOWER EXTREMITY VENOUS (DVT)   Ambulatory referral to Vascular Surgery       Follow up plan: Return for follow up with results.

## 2016-07-09 NOTE — Assessment & Plan Note (Addendum)
Discussed self care.  He plans to use an OTC product

## 2016-07-10 ENCOUNTER — Other Ambulatory Visit (INDEPENDENT_AMBULATORY_CARE_PROVIDER_SITE_OTHER): Payer: Self-pay

## 2016-07-10 DIAGNOSIS — M7989 Other specified soft tissue disorders: Secondary | ICD-10-CM

## 2016-07-24 ENCOUNTER — Ambulatory Visit (INDEPENDENT_AMBULATORY_CARE_PROVIDER_SITE_OTHER): Payer: Self-pay | Admitting: Unknown Physician Specialty

## 2016-07-24 ENCOUNTER — Encounter: Payer: Self-pay | Admitting: Unknown Physician Specialty

## 2016-07-24 VITALS — BP 121/83 | HR 61 | Temp 97.9°F | Ht 73.3 in | Wt 333.0 lb

## 2016-07-24 DIAGNOSIS — Z0000 Encounter for general adult medical examination without abnormal findings: Secondary | ICD-10-CM

## 2016-07-24 DIAGNOSIS — M109 Gout, unspecified: Secondary | ICD-10-CM

## 2016-07-24 DIAGNOSIS — M7989 Other specified soft tissue disorders: Secondary | ICD-10-CM

## 2016-07-24 DIAGNOSIS — I10 Essential (primary) hypertension: Secondary | ICD-10-CM

## 2016-07-24 NOTE — Assessment & Plan Note (Signed)
Stable, continue present medications.   Working on cutting back on the allopurinol

## 2016-07-24 NOTE — Assessment & Plan Note (Signed)
Stable, continue present medications.   

## 2016-07-24 NOTE — Progress Notes (Signed)
BP 121/83   Pulse 61   Temp 97.9 F (36.6 C)   Ht 6' 1.3" (1.862 m) Comment: pt had boots on  Wt (!) 333 lb (151 kg) Comment: pt had boots on  SpO2 97%   BMI 43.58 kg/m    Subjective:    Patient ID: Brian Thornton, male    DOB: 1980-05-30, 36 y.o.   MRN: [id number]  HPI: Brian Ravenhillip G Pangallo is a 36 y.o. male  Chief Complaint  Patient presents with  . Hypertension  . Results    Pt is here to f/u right calf swelling.  Vascular test was normal per Dr. Driscilla Grammesew's office.  Scan reviewed.    Also needs annual physical  Social History   Social History  . Marital status: Single    Spouse name: N/A  . Number of children: N/A  . Years of education: N/A   Occupational History  . Not on file.   Social History Main Topics  . Smoking status: Never Smoker  . Smokeless tobacco: Never Used  . Alcohol use No  . Drug use: No  . Sexual activity: Yes   Other Topics Concern  . Not on file   Social History Narrative  . No narrative on file   Family History  Problem Relation Age of Onset  . Diabetes Father   . Gout Brother   . Heart attack Maternal Grandmother   . Cancer Maternal Grandfather     colon  . Cancer Paternal Grandfather     prostate   Past Medical History:  Diagnosis Date  . Dysmetabolic syndrome   . Extreme obesity   . Gout   . Hyperlipidemia   . Hypertension   . IFG (impaired fasting glucose)    History reviewed. No pertinent surgical history.   Relevant past medical, surgical, family and social history reviewed and updated as indicated. Interim medical history since our last visit reviewed. Allergies and medications reviewed and updated.  Review of Systems  Per HPI unless specifically indicated above     Objective:    BP 121/83   Pulse 61   Temp 97.9 F (36.6 C)   Ht 6' 1.3" (1.862 m) Comment: pt had boots on  Wt (!) 333 lb (151 kg) Comment: pt had boots on  SpO2 97%   BMI 43.58 kg/m   Wt Readings from Last 3 Encounters:  07/24/16 (!) 333  lb (151 kg)  07/09/16 (!) 335 lb 3.2 oz (152 kg)  04/25/16 (!) 373 lb 12.8 oz (169.6 kg)    Physical Exam  Constitutional: He is oriented to person, place, and time. He appears well-developed and well-nourished. No distress.  HENT:  Head: Normocephalic and atraumatic.  Eyes: Conjunctivae and lids are normal. Right eye exhibits no discharge. Left eye exhibits no discharge. No scleral icterus.  Neck: Normal range of motion. Neck supple. No JVD present. Carotid bruit is not present.  Cardiovascular: Normal rate, regular rhythm and normal heart sounds.   Pulmonary/Chest: Effort normal and breath sounds normal. No respiratory distress.  Abdominal: Normal appearance. There is no splenomegaly or hepatomegaly.  Musculoskeletal: Normal range of motion.  Right calf still larger.  Dr. Laural BenesJohnson came in and evaluated leg as well.    Neurological: He is alert and oriented to person, place, and time.  Skin: Skin is warm, dry and intact. No rash noted. No pallor.  Psychiatric: He has a normal mood and affect. His behavior is normal. Judgment and thought content normal.  Results for orders placed or performed in visit on 10/19/15  Comprehensive metabolic panel  Result Value Ref Range   Glucose 99 65 - 99 mg/dL   BUN 15 6 - 20 mg/dL   Creatinine, Ser 1.61 0.76 - 1.27 mg/dL   GFR calc non Af Amer 81 >59 mL/min/1.73   GFR calc Af Amer 93 >59 mL/min/1.73   BUN/Creatinine Ratio 13 9 - 20   Sodium 144 134 - 144 mmol/L   Potassium 4.4 3.5 - 5.2 mmol/L   Chloride 103 96 - 106 mmol/L   CO2 28 18 - 29 mmol/L   Calcium 9.6 8.7 - 10.2 mg/dL   Total Protein 6.5 6.0 - 8.5 g/dL   Albumin 4.1 3.5 - 5.5 g/dL   Globulin, Total 2.4 1.5 - 4.5 g/dL   Albumin/Globulin Ratio 1.7 1.2 - 2.2   Bilirubin Total 0.3 0.0 - 1.2 mg/dL   Alkaline Phosphatase 86 39 - 117 IU/L   AST 25 0 - 40 IU/L   ALT 31 0 - 44 IU/L  Uric acid  Result Value Ref Range   Uric Acid 6.6 3.7 - 8.6 mg/dL  Bayer DCA Hb W9U Waived  Result  Value Ref Range   Bayer DCA Hb A1c Waived 6.4 <7.0 %      Assessment & Plan:   Problem List Items Addressed This Visit      Unprioritized   Gout    Stable, continue present medications.   Working on cutting back on the allopurinol       Hypertension    Stable, continue present medications.        Relevant Orders   Comprehensive metabolic panel   Lipid Panel w/o Chol/HDL Ratio    Other Visit Diagnoses    Annual physical exam    -  Primary   Relevant Orders   CBC with Differential/Platelet   Comprehensive metabolic panel   Lipid Panel w/o Chol/HDL Ratio   TSH   Right leg swelling       Seems to be part of pt's habitus.  Discussed with Dr. Laural Benes who examined pt      Continue with present diet and exercise  Follow up plan: Return in about 6 months (around 01/24/2017).

## 2016-07-25 ENCOUNTER — Encounter: Payer: Self-pay | Admitting: Unknown Physician Specialty

## 2016-07-25 LAB — CBC WITH DIFFERENTIAL/PLATELET
Basophils Absolute: 0 10*3/uL (ref 0.0–0.2)
Basos: 0 %
EOS (ABSOLUTE): 0.3 10*3/uL (ref 0.0–0.4)
Eos: 4 %
Hematocrit: 46.4 % (ref 37.5–51.0)
Hemoglobin: 15.5 g/dL (ref 13.0–17.7)
Immature Grans (Abs): 0 10*3/uL (ref 0.0–0.1)
Immature Granulocytes: 0 %
Lymphocytes Absolute: 2.3 10*3/uL (ref 0.7–3.1)
Lymphs: 29 %
MCH: 28.8 pg (ref 26.6–33.0)
MCHC: 33.4 g/dL (ref 31.5–35.7)
MCV: 86 fL (ref 79–97)
Monocytes Absolute: 0.5 10*3/uL (ref 0.1–0.9)
Monocytes: 6 %
Neutrophils Absolute: 4.9 10*3/uL (ref 1.4–7.0)
Neutrophils: 61 %
Platelets: 200 10*3/uL (ref 150–379)
RBC: 5.38 x10E6/uL (ref 4.14–5.80)
RDW: 15.6 % — ABNORMAL HIGH (ref 12.3–15.4)
WBC: 8.1 10*3/uL (ref 3.4–10.8)

## 2016-07-25 LAB — COMPREHENSIVE METABOLIC PANEL WITH GFR
ALT: 24 [IU]/L (ref 0–44)
AST: 23 [IU]/L (ref 0–40)
Albumin/Globulin Ratio: 1.9 (ref 1.2–2.2)
Albumin: 4.4 g/dL (ref 3.5–5.5)
Alkaline Phosphatase: 81 [IU]/L (ref 39–117)
BUN/Creatinine Ratio: 14 (ref 9–20)
BUN: 14 mg/dL (ref 6–20)
Bilirubin Total: 0.5 mg/dL (ref 0.0–1.2)
CO2: 27 mmol/L (ref 18–29)
Calcium: 9.3 mg/dL (ref 8.7–10.2)
Chloride: 102 mmol/L (ref 96–106)
Creatinine, Ser: 0.98 mg/dL (ref 0.76–1.27)
GFR calc Af Amer: 116 mL/min/{1.73_m2}
GFR calc non Af Amer: 100 mL/min/{1.73_m2}
Globulin, Total: 2.3 g/dL (ref 1.5–4.5)
Glucose: 87 mg/dL (ref 65–99)
Potassium: 4.6 mmol/L (ref 3.5–5.2)
Sodium: 142 mmol/L (ref 134–144)
Total Protein: 6.7 g/dL (ref 6.0–8.5)

## 2016-07-25 LAB — LIPID PANEL W/O CHOL/HDL RATIO
Cholesterol, Total: 146 mg/dL (ref 100–199)
HDL: 38 mg/dL — ABNORMAL LOW (ref >39)
LDL CALC: 95 mg/dL (ref 0–99)
TRIGLYCERIDES: 66 mg/dL (ref 0–149)
VLDL Cholesterol Cal: 13 mg/dL (ref 5–40)

## 2016-07-25 LAB — TSH: TSH: 0.998 u[IU]/mL (ref 0.450–4.500)

## 2016-09-27 ENCOUNTER — Other Ambulatory Visit: Payer: Self-pay | Admitting: Unknown Physician Specialty

## 2016-11-06 ENCOUNTER — Ambulatory Visit: Payer: Self-pay | Admitting: Unknown Physician Specialty

## 2017-01-29 ENCOUNTER — Encounter: Payer: Self-pay | Admitting: Unknown Physician Specialty

## 2017-01-29 ENCOUNTER — Ambulatory Visit (INDEPENDENT_AMBULATORY_CARE_PROVIDER_SITE_OTHER): Payer: Self-pay | Admitting: Unknown Physician Specialty

## 2017-01-29 DIAGNOSIS — G4733 Obstructive sleep apnea (adult) (pediatric): Secondary | ICD-10-CM

## 2017-01-29 DIAGNOSIS — M109 Gout, unspecified: Secondary | ICD-10-CM

## 2017-01-29 DIAGNOSIS — I10 Essential (primary) hypertension: Secondary | ICD-10-CM

## 2017-01-29 MED ORDER — ALLOPURINOL 300 MG PO TABS: 600.0000 mg | ORAL_TABLET | Freq: Every day | ORAL | 3 refills | Status: DC

## 2017-01-29 NOTE — Progress Notes (Signed)
BP 123/82   Pulse 70   Temp 98 F (36.7 C) (Oral)   Wt (!) 365 lb 3.2 oz (165.7 kg)   SpO2 96%   BMI 47.79 kg/m    Subjective:    Patient ID: Brian Thornton, male    DOB: Sep 22, 1980, 36 y.o.   MRN: [id number]  HPI: Brian Thornton is a 36 y.o. male  Chief Complaint  Patient presents with  . Gout  . Hypertension   Hypertension Using medications without difficulty Average home BPs Not checking   No problems or lightheadedness No chest pain with exertion or shortness of breath No Edema   Hyperlipidemia Using medications without problems: No Muscle aches  Diet compliance:Exercise: Not exercising much but cutting back  Sleep apnea Not completed work-up due to insurance issues.    Gout No new gout flares  Relevant past medical, surgical, family and social history reviewed and updated as indicated. Interim medical history since our last visit reviewed. Allergies and medications reviewed and updated.  Review of Systems  Per HPI unless specifically indicated above     Objective:    BP 123/82   Pulse 70   Temp 98 F (36.7 C) (Oral)   Wt (!) 365 lb 3.2 oz (165.7 kg)   SpO2 96%   BMI 47.79 kg/m   Wt Readings from Last 3 Encounters:  01/29/17 (!) 365 lb 3.2 oz (165.7 kg)  07/24/16 (!) 333 lb (151 kg)  07/09/16 (!) 335 lb 3.2 oz (152 kg)    Physical Exam  Constitutional: He is oriented to person, place, and time. He appears well-developed and well-nourished. No distress.  HENT:  Head: Normocephalic and atraumatic.  Eyes: Conjunctivae and lids are normal. Right eye exhibits no discharge. Left eye exhibits no discharge. No scleral icterus.  Neck: Normal range of motion. Neck supple. No JVD present. Carotid bruit is not present.  Cardiovascular: Normal rate, regular rhythm and normal heart sounds.  Pulmonary/Chest: Effort normal and breath sounds normal. No respiratory distress.  Abdominal: Normal appearance. There is no splenomegaly or hepatomegaly.    Musculoskeletal: Normal range of motion.  Neurological: He is alert and oriented to person, place, and time.  Skin: Skin is warm, dry and intact. No rash noted. No pallor.  Psychiatric: He has a normal mood and affect. His behavior is normal. Judgment and thought content normal.    Results for orders placed or performed in visit on 07/24/16  CBC with Differential/Platelet  Result Value Ref Range   WBC 8.1 3.4 - 10.8 x10E3/uL   RBC 5.38 4.14 - 5.80 x10E6/uL   Hemoglobin 15.5 13.0 - 17.7 g/dL   Hematocrit 95.646.4 38.737.5 - 51.0 %   MCV 86 79 - 97 fL   MCH 28.8 26.6 - 33.0 pg   MCHC 33.4 31.5 - 35.7 g/dL   RDW 56.415.6 (H) 33.212.3 - 95.115.4 %   Platelets 200 150 - 379 x10E3/uL   Neutrophils 61 Not Estab. %   Lymphs 29 Not Estab. %   Monocytes 6 Not Estab. %   Eos 4 Not Estab. %   Basos 0 Not Estab. %   Neutrophils Absolute 4.9 1.4 - 7.0 x10E3/uL   Lymphocytes Absolute 2.3 0.7 - 3.1 x10E3/uL   Monocytes Absolute 0.5 0.1 - 0.9 x10E3/uL   EOS (ABSOLUTE) 0.3 0.0 - 0.4 x10E3/uL   Basophils Absolute 0.0 0.0 - 0.2 x10E3/uL   Immature Granulocytes 0 Not Estab. %   Immature Grans (Abs) 0.0 0.0 - 0.1 x10E3/uL  Comprehensive metabolic panel  Result Value Ref Range   Glucose 87 65 - 99 mg/dL   BUN 14 6 - 20 mg/dL   Creatinine, Ser 9.140.98 0.76 - 1.27 mg/dL   GFR calc non Af Amer 100 >59 mL/min/1.73   GFR calc Af Amer 116 >59 mL/min/1.73   BUN/Creatinine Ratio 14 9 - 20   Sodium 142 134 - 144 mmol/L   Potassium 4.6 3.5 - 5.2 mmol/L   Chloride 102 96 - 106 mmol/L   CO2 27 18 - 29 mmol/L   Calcium 9.3 8.7 - 10.2 mg/dL   Total Protein 6.7 6.0 - 8.5 g/dL   Albumin 4.4 3.5 - 5.5 g/dL   Globulin, Total 2.3 1.5 - 4.5 g/dL   Albumin/Globulin Ratio 1.9 1.2 - 2.2   Bilirubin Total 0.5 0.0 - 1.2 mg/dL   Alkaline Phosphatase 81 39 - 117 IU/L   AST 23 0 - 40 IU/L   ALT 24 0 - 44 IU/L  Lipid Panel w/o Chol/HDL Ratio  Result Value Ref Range   Cholesterol, Total 146 100 - 199 mg/dL   Triglycerides 66 0 - 149  mg/dL   HDL 38 (L) >78>39 mg/dL   VLDL Cholesterol Cal 13 5 - 40 mg/dL   LDL Calculated 95 0 - 99 mg/dL  TSH  Result Value Ref Range   TSH 0.998 0.450 - 4.500 uIU/mL      Assessment & Plan:   Problem List Items Addressed This Visit      Unprioritized   Gout    Stable, continue present medications.        Hypertension    Stable, continue present medications.        Sleep apnea    Unable to complete sleep study due to insurance issues          Follow up plan: Return in about 6 months (around 07/29/2017) for physical.

## 2017-01-29 NOTE — Assessment & Plan Note (Signed)
Unable to complete sleep study due to insurance issues

## 2017-01-29 NOTE — Assessment & Plan Note (Signed)
Stable, continue present medications.   

## 2017-07-31 ENCOUNTER — Encounter: Payer: Self-pay | Admitting: Unknown Physician Specialty

## 2017-08-23 ENCOUNTER — Encounter: Payer: Self-pay | Admitting: Unknown Physician Specialty

## 2017-08-23 ENCOUNTER — Ambulatory Visit (INDEPENDENT_AMBULATORY_CARE_PROVIDER_SITE_OTHER): Payer: BLUE CROSS/BLUE SHIELD | Admitting: Unknown Physician Specialty

## 2017-08-23 VITALS — BP 123/81 | HR 96 | Temp 97.9°F | Ht 71.5 in | Wt 380.0 lb

## 2017-08-23 DIAGNOSIS — Z0000 Encounter for general adult medical examination without abnormal findings: Secondary | ICD-10-CM | POA: Diagnosis not present

## 2017-08-23 DIAGNOSIS — E668 Other obesity: Secondary | ICD-10-CM | POA: Diagnosis not present

## 2017-08-23 DIAGNOSIS — E790 Hyperuricemia without signs of inflammatory arthritis and tophaceous disease: Secondary | ICD-10-CM | POA: Diagnosis not present

## 2017-08-23 DIAGNOSIS — I10 Essential (primary) hypertension: Secondary | ICD-10-CM

## 2017-08-23 DIAGNOSIS — R7301 Impaired fasting glucose: Secondary | ICD-10-CM | POA: Diagnosis not present

## 2017-08-23 LAB — BAYER DCA HB A1C WAIVED: HB A1C (BAYER DCA - WAIVED): 5.3 % (ref <7.0)

## 2017-08-23 NOTE — Patient Instructions (Signed)
Preventive Care 40-64 Years, Male Preventive care refers to lifestyle choices and visits with your health care provider that can promote health and wellness. What does preventive care include?  A yearly physical exam. This is also called an annual well check.  Dental exams once or twice a year.  Routine eye exams. Ask your health care provider how often you should have your eyes checked.  Personal lifestyle choices, including: ? Daily care of your teeth and gums. ? Regular physical activity. ? Eating a healthy diet. ? Avoiding tobacco and drug use. ? Limiting alcohol use. ? Practicing safe sex. ? Taking low-dose aspirin every day starting at age 39. What happens during an annual well check? The services and screenings done by your health care provider during your annual well check will depend on your age, overall health, lifestyle risk factors, and family history of disease. Counseling Your health care provider may ask you questions about your:  Alcohol use.  Tobacco use.  Drug use.  Emotional well-being.  Home and relationship well-being.  Sexual activity.  Eating habits.  Work and work Statistician.  Screening You may have the following tests or measurements:  Height, weight, and BMI.  Blood pressure.  Lipid and cholesterol levels. These may be checked every 5 years, or more frequently if you are over 76 years old.  Skin check.  Lung cancer screening. You may have this screening every year starting at age 19 if you have a 30-pack-year history of smoking and currently smoke or have quit within the past 15 years.  Fecal occult blood test (FOBT) of the stool. You may have this test every year starting at age 26.  Flexible sigmoidoscopy or colonoscopy. You may have a sigmoidoscopy every 5 years or a colonoscopy every 10 years starting at age 17.  Prostate cancer screening. Recommendations will vary depending on your family history and other risks.  Hepatitis C  blood test.  Hepatitis B blood test.  Sexually transmitted disease (STD) testing.  Diabetes screening. This is done by checking your blood sugar (glucose) after you have not eaten for a while (fasting). You may have this done every 1-3 years.  Discuss your test results, treatment options, and if necessary, the need for more tests with your health care provider. Vaccines Your health care provider may recommend certain vaccines, such as:  Influenza vaccine. This is recommended every year.  Tetanus, diphtheria, and acellular pertussis (Tdap, Td) vaccine. You may need a Td booster every 10 years.  Varicella vaccine. You may need this if you have not been vaccinated.  Zoster vaccine. You may need this after age 79.  Measles, mumps, and rubella (MMR) vaccine. You may need at least one dose of MMR if you were born in 1957 or later. You may also need a second dose.  Pneumococcal 13-valent conjugate (PCV13) vaccine. You may need this if you have certain conditions and have not been vaccinated.  Pneumococcal polysaccharide (PPSV23) vaccine. You may need one or two doses if you smoke cigarettes or if you have certain conditions.  Meningococcal vaccine. You may need this if you have certain conditions.  Hepatitis A vaccine. You may need this if you have certain conditions or if you travel or work in places where you may be exposed to hepatitis A.  Hepatitis B vaccine. You may need this if you have certain conditions or if you travel or work in places where you may be exposed to hepatitis B.  Haemophilus influenzae type b (Hib) vaccine.  You may need this if you have certain risk factors.  Talk to your health care provider about which screenings and vaccines you need and how often you need them. This information is not intended to replace advice given to you by your health care provider. Make sure you discuss any questions you have with your health care provider. Document Released: 04/01/2015  Document Revised: 11/23/2015 Document Reviewed: 01/04/2015 Elsevier Interactive Patient Education  2018 [place] 18-39 Years, Male Preventive care refers to lifestyle choices and visits with your health care provider that can promote health and wellness. What does preventive care include?  A yearly physical exam. This is also called an annual well check.  Dental exams once or twice a year.  Routine eye exams. Ask your health care provider how often you should have your eyes checked.  Personal lifestyle choices, including: ? Daily care of your teeth and gums. ? Regular physical activity. ? Eating a healthy diet. ? Avoiding tobacco and drug use. ? Limiting alcohol use. ? Practicing safe sex. What happens during an annual well check? The services and screenings done by your health care provider during your annual well check will depend on your age, overall health, lifestyle risk factors, and family history of disease. Counseling Your health care provider may ask you questions about your:  Alcohol use.  Tobacco use.  Drug use.  Emotional well-being.  Home and relationship well-being.  Sexual activity.  Eating habits.  Work and work Statistician.  Screening You may have the following tests or measurements:  Height, weight, and BMI.  Blood pressure.  Lipid and cholesterol levels. These may be checked every 5 years starting at age 80.  Diabetes screening. This is done by checking your blood sugar (glucose) after you have not eaten for a while (fasting).  Skin check.  Hepatitis C blood test.  Hepatitis B blood test.  Sexually transmitted disease (STD) testing.  Discuss your test results, treatment options, and if necessary, the need for more tests with your health care provider. Vaccines Your health care provider may recommend certain vaccines, such as:  Influenza vaccine. This is recommended every year.  Tetanus, diphtheria, and  acellular pertussis (Tdap, Td) vaccine. You may need a Td booster every 10 years.  Varicella vaccine. You may need this if you have not been vaccinated.  HPV vaccine. If you are 14 or younger, you may need three doses over 6 months.  Measles, mumps, and rubella (MMR) vaccine. You may need at least one dose of MMR.You may also need a second dose.  Pneumococcal 13-valent conjugate (PCV13) vaccine. You may need this if you have certain conditions and have not been vaccinated.  Pneumococcal polysaccharide (PPSV23) vaccine. You may need one or two doses if you smoke cigarettes or if you have certain conditions.  Meningococcal vaccine. One dose is recommended if you are age 108-21 years and a first-year college student living in a residence hall, or if you have one of several medical conditions. You may also need additional booster doses.  Hepatitis A vaccine. You may need this if you have certain conditions or if you travel or work in places where you may be exposed to hepatitis A.  Hepatitis B vaccine. You may need this if you have certain conditions or if you travel or work in places where you may be exposed to hepatitis B.  Haemophilus influenzae type b (Hib) vaccine. You may need this if you have certain risk factors.  Talk to  your health care provider about which screenings and vaccines you need and how often you need them. This information is not intended to replace advice given to you by your health care provider. Make sure you discuss any questions you have with your health care provider. Document Released: 05/01/2001 Document Revised: 11/23/2015 Document Reviewed: 01/04/2015 Elsevier Interactive Patient Education  [employer].

## 2017-08-23 NOTE — Assessment & Plan Note (Signed)
Stable, continue present medications.   

## 2017-08-23 NOTE — Assessment & Plan Note (Signed)
Check Hgb A1C today 

## 2017-08-23 NOTE — Progress Notes (Signed)
BP 123/81   Pulse 96   Temp 97.9 F (36.6 C) (Oral)   Ht 5' 11.5" (1.816 m)   Wt (!) 380 lb (172.4 kg)   SpO2 98%   BMI 52.26 kg/m    Subjective:    Patient ID: Brian Thornton, male    DOB: April 13, 1980, 37 y.o.   MRN: [id number]  HPI: Brian Ravenhillip G Kolasinski is a 37 y.o. male  Chief Complaint  Patient presents with  . Annual Exam   Hypertension Using medications without difficulty Average home BPs   No problems or lightheadedness No chest pain with exertion or shortness of breath No Edema  Sleep apnea - never got tested due to insurance problems.  Wife hasn't complained about snoring and sleepy all the time  Social History   Socioeconomic History  . Marital status: Single    Spouse name: Not on file  . Number of children: Not on file  . Years of education: Not on file  . Highest education level: Not on file  Occupational History  . Not on file  Social Needs  . Financial resource strain: Not on file  . Food insecurity:    Worry: Not on file    Inability: Not on file  . Transportation needs:    Medical: Not on file    Non-medical: Not on file  Tobacco Use  . Smoking status: Never Smoker  . Smokeless tobacco: Never Used  Substance and Sexual Activity  . Alcohol use: No    Alcohol/week: 0.0 oz  . Drug use: No  . Sexual activity: Yes  Lifestyle  . Physical activity:    Days per week: Not on file    Minutes per session: Not on file  . Stress: Not on file  Relationships  . Social connections:    Talks on phone: Not on file    Gets together: Not on file    Attends religious service: Not on file    Active member of club or organization: Not on file    Attends meetings of clubs or organizations: Not on file    Relationship status: Not on file  . Intimate partner violence:    Fear of current or ex partner: Not on file    Emotionally abused: Not on file    Physically abused: Not on file    Forced sexual activity: Not on file  Other Topics Concern  . Not on  file  Social History Narrative  . Not on file   Family History  Problem Relation Age of Onset  . Diabetes Father   . Gout Brother   . Heart attack Maternal Grandmother   . Cancer Maternal Grandfather        colon  . Cancer Paternal Grandfather        prostate   [facility] 08/23/2017 01/29/2017 02/21/2015  Decreased Interest 0 0 0  Down, Depressed, Hopeless 0 0 0  PHQ - 2 Score 0 0 0  Altered sleeping - 0 -  Tired, decreased energy - 0 -  Change in appetite - 0 -  Feeling bad or failure about yourself  - 0 -  Trouble concentrating - 0 -  Moving slowly or fidgety/restless - 0 -  Suicidal thoughts - 0 -  PHQ-9 Score - 0 -   History reviewed. No pertinent surgical history.  Relevant past medical, surgical, family and social history reviewed and updated as indicated. Interim medical history since our last visit reviewed. Allergies and  medications reviewed and updated.  Review of Systems  Per HPI unless specifically indicated above     Objective:    BP 123/81   Pulse 96   Temp 97.9 F (36.6 C) (Oral)   Ht 5' 11.5" (1.816 m)   Wt (!) 380 lb (172.4 kg)   SpO2 98%   BMI 52.26 kg/m   Wt Readings from Last 3 Encounters:  08/23/17 (!) 380 lb (172.4 kg)  01/29/17 (!) 365 lb 3.2 oz (165.7 kg)  07/24/16 (!) 333 lb (151 kg)    Physical Exam  Constitutional: He is oriented to person, place, and time. He appears well-developed and well-nourished. No distress.  HENT:  Head: Normocephalic and atraumatic.  Eyes: Conjunctivae and lids are normal. Right eye exhibits no discharge. Left eye exhibits no discharge. No scleral icterus.  Neck: Normal range of motion. Neck supple. No JVD present. Carotid bruit is not present.  Cardiovascular: Normal rate, regular rhythm and normal heart sounds.  Pulmonary/Chest: Effort normal and breath sounds normal. No respiratory distress.  Abdominal: Normal appearance. There is no splenomegaly or hepatomegaly.  Musculoskeletal: Normal  range of motion.  Neurological: He is alert and oriented to person, place, and time.  Skin: Skin is warm, dry and intact. No rash noted. No pallor.  Psychiatric: He has a normal mood and affect. His behavior is normal. Judgment and thought content normal.      Assessment & Plan:   Problem List Items Addressed This Visit      Unprioritized   Extreme obesity    Discussed healthy diet and exercise habits      Hypertension    Stable, continue present medications.        Relevant Orders   Comprehensive metabolic panel   Lipid Panel w/o Chol/HDL Ratio   Uric acid   Hyperuricemia    Taking allopurinol.  No gout flares.  Check labs today      Relevant Orders   Uric acid   IFG (impaired fasting glucose)    Check Hgb A1C today      Relevant Orders   Bayer DCA Hb A1c Waived    Other Visit Diagnoses    Annual physical exam    -  Primary   Relevant Orders   CBC with Differential/Platelet   TSH      HM Td due next year  Follow up plan: Return in about 6 months (around 02/22/2018).

## 2017-08-23 NOTE — Assessment & Plan Note (Signed)
Discussed healthy diet and exercise habits

## 2017-08-23 NOTE — Assessment & Plan Note (Signed)
Taking allopurinol.  No gout flares.  Check labs today

## 2017-08-24 LAB — COMPREHENSIVE METABOLIC PANEL
A/G RATIO: 2 (ref 1.2–2.2)
ALT: 42 IU/L (ref 0–44)
AST: 32 IU/L (ref 0–40)
Albumin: 4.7 g/dL (ref 3.5–5.5)
Alkaline Phosphatase: 79 IU/L (ref 39–117)
BILIRUBIN TOTAL: 0.5 mg/dL (ref 0.0–1.2)
BUN/Creatinine Ratio: 14 (ref 9–20)
BUN: 13 mg/dL (ref 6–20)
CHLORIDE: 102 mmol/L (ref 96–106)
CO2: 26 mmol/L (ref 20–29)
Calcium: 9.7 mg/dL (ref 8.7–10.2)
Creatinine, Ser: 0.93 mg/dL (ref 0.76–1.27)
GFR calc Af Amer: 123 mL/min/{1.73_m2} (ref >59)
GFR calc non Af Amer: 106 mL/min/{1.73_m2} (ref >59)
Globulin, Total: 2.4 g/dL (ref 1.5–4.5)
Glucose: 71 mg/dL (ref 65–99)
POTASSIUM: 3.9 mmol/L (ref 3.5–5.2)
Sodium: 142 mmol/L (ref 134–144)
Total Protein: 7.1 g/dL (ref 6.0–8.5)

## 2017-08-24 LAB — CBC WITH DIFFERENTIAL/PLATELET
BASOS: 0 %
Basophils Absolute: 0 10*3/uL (ref 0.0–0.2)
EOS (ABSOLUTE): 0.4 10*3/uL (ref 0.0–0.4)
Eos: 4 %
Hematocrit: 46.4 % (ref 37.5–51.0)
Hemoglobin: 15.8 g/dL (ref 13.0–17.7)
IMMATURE GRANULOCYTES: 0 %
Immature Grans (Abs): 0 10*3/uL (ref 0.0–0.1)
Lymphocytes Absolute: 3 10*3/uL (ref 0.7–3.1)
Lymphs: 33 %
MCH: 29.4 pg (ref 26.6–33.0)
MCHC: 34.1 g/dL (ref 31.5–35.7)
MCV: 86 fL (ref 79–97)
MONOS ABS: 0.4 10*3/uL (ref 0.1–0.9)
Monocytes: 5 %
NEUTROS ABS: 5.2 10*3/uL (ref 1.4–7.0)
NEUTROS PCT: 58 %
Platelets: 193 10*3/uL (ref 150–450)
RBC: 5.38 x10E6/uL (ref 4.14–5.80)
RDW: 14.8 % (ref 12.3–15.4)
WBC: 9 10*3/uL (ref 3.4–10.8)

## 2017-08-24 LAB — LIPID PANEL W/O CHOL/HDL RATIO
Cholesterol, Total: 181 mg/dL (ref 100–199)
HDL: 34 mg/dL — AB (ref >39)
LDL Calculated: 102 mg/dL — ABNORMAL HIGH (ref 0–99)
TRIGLYCERIDES: 227 mg/dL — AB (ref 0–149)
VLDL Cholesterol Cal: 45 mg/dL — ABNORMAL HIGH (ref 5–40)

## 2017-08-24 LAB — URIC ACID: Uric Acid: 5.2 mg/dL (ref 3.7–8.6)

## 2017-08-24 LAB — TSH: TSH: 2.25 u[IU]/mL (ref 0.450–4.500)

## 2018-02-05 ENCOUNTER — Encounter: Payer: Self-pay | Admitting: Unknown Physician Specialty

## 2018-02-06 ENCOUNTER — Other Ambulatory Visit: Payer: Self-pay | Admitting: Unknown Physician Specialty

## 2018-02-28 ENCOUNTER — Ambulatory Visit: Payer: BLUE CROSS/BLUE SHIELD | Admitting: Nurse Practitioner

## 2018-02-28 ENCOUNTER — Ambulatory Visit: Payer: BLUE CROSS/BLUE SHIELD | Admitting: Unknown Physician Specialty

## 2018-02-28 ENCOUNTER — Encounter: Payer: Self-pay | Admitting: Nurse Practitioner

## 2018-02-28 VITALS — BP 118/75 | HR 69 | Temp 97.6°F | Ht 71.5 in | Wt 396.6 lb

## 2018-02-28 DIAGNOSIS — I10 Essential (primary) hypertension: Secondary | ICD-10-CM | POA: Diagnosis not present

## 2018-02-28 DIAGNOSIS — M109 Gout, unspecified: Secondary | ICD-10-CM | POA: Diagnosis not present

## 2018-02-28 DIAGNOSIS — E668 Other obesity: Secondary | ICD-10-CM

## 2018-02-28 DIAGNOSIS — E782 Mixed hyperlipidemia: Secondary | ICD-10-CM

## 2018-02-28 MED ORDER — INDOMETHACIN 50 MG PO CAPS: 50.0000 mg | ORAL_CAPSULE | Freq: Three times a day (TID) | ORAL | 0 refills | Status: DC | PRN

## 2018-02-28 NOTE — Assessment & Plan Note (Signed)
No current medications.  Discussed at length low cholesterol diet regimen.  CMP and lipid panel today.

## 2018-02-28 NOTE — Progress Notes (Signed)
BP 118/75   Pulse 69   Temp 97.6 F (36.4 C) (Oral)   Ht 5' 11.5" (1.816 m)   Wt (!) 396 lb 9.6 oz (179.9 kg)   SpO2 96%   BMI 54.54 kg/m    Subjective:    Patient ID: Brian Thornton, male    DOB: 08/04/1980, 37 y.o.   MRN: [id number]  HPI: Brian Thornton is a 37 y.o. male presents for follow-up visit HTN & gout  Chief Complaint  Patient presents with  . Gout  . Hypertension   HYPERTENSION / HYPERLIPIDEMIA Continues on Lisinopril for HTN.  No current cholesterol medication, but elevation noted in June 2019 labs. Satisfied with current treatment? yes Duration of hypertension: chronic BP monitoring frequency: not checking BP range:  BP medication side effects: no Duration of hyperlipidemia: chronic Cholesterol supplements: none Medication compliance: no current medications Aspirin: no Recent stressors: no Recurrent headaches: no Visual changes: no Palpitations: no Dyspnea: no Chest pain: no Lower extremity edema: no Dizzy/lightheaded: no  GOUT Continues on daily Allopurinol with June 2019 uric acid 5,2.  He denies any pain today.  Is requesting Indomethacin script to keep on hand in case flare presents.  He reports he has had a couple times over the past year where he has felt "a little ache" and thought flare was coming, but it did not.  Would like medicine at pharmacy in case he requires it.  OBESITY: Has gained 16 pounds since June 2019.  Had at length discussion with patient about benefits of weight loss and attempting gradual loss.  He does endorse drinking sodas and sweet tea frequently.  Discussed with him starting by cutting back on soda and sweet tea intake.  Exercise for 30 minutes, 5 days a week (this can be walking).  Discussed benefit of doing gradual changes vs rapid changes, integrating changes as a habit.  Relevant past medical, surgical, family and social history reviewed and updated as indicated. Interim medical history since our last visit  reviewed. Allergies and medications reviewed and updated.  Review of Systems  Constitutional: Negative for activity change, diaphoresis, fatigue and fever.  Respiratory: Negative for cough, chest tightness, shortness of breath and wheezing.   Cardiovascular: Negative for chest pain, palpitations and leg swelling.  Gastrointestinal: Negative for abdominal distention, abdominal pain, constipation, diarrhea, nausea and vomiting.  Endocrine: Negative for cold intolerance, heat intolerance, polydipsia, polyphagia and polyuria.  Musculoskeletal: Negative.   Skin: Negative.   Neurological: Negative for dizziness, syncope, weakness, light-headedness, numbness and headaches.  Psychiatric/Behavioral: Negative.     Per HPI unless specifically indicated above     Objective:    BP 118/75   Pulse 69   Temp 97.6 F (36.4 C) (Oral)   Ht 5' 11.5" (1.816 m)   Wt (!) 396 lb 9.6 oz (179.9 kg)   SpO2 96%   BMI 54.54 kg/m   Wt Readings from Last 3 Encounters:  02/28/18 (!) 396 lb 9.6 oz (179.9 kg)  08/23/17 (!) 380 lb (172.4 kg)  01/29/17 (!) 365 lb 3.2 oz (165.7 kg)    Physical Exam Vitals signs and nursing note reviewed.  Constitutional:      Appearance: He is well-developed.  HENT:     Head: Normocephalic and atraumatic.     Right Ear: Hearing normal. No drainage.     Left Ear: Hearing normal. No drainage.     Mouth/Throat:     Pharynx: Uvula midline.  Eyes:     General: Lids are  normal.        Right eye: No discharge.        Left eye: No discharge.     Conjunctiva/sclera: Conjunctivae normal.     Pupils: Pupils are equal, round, and reactive to light.  Neck:     Musculoskeletal: Normal range of motion and neck supple.     Thyroid: No thyromegaly.     Vascular: No carotid bruit or JVD.     Trachea: Trachea normal.  Cardiovascular:     Rate and Rhythm: Normal rate and regular rhythm.     Heart sounds: Normal heart sounds, S1 normal and S2 normal. No murmur. No gallop.     Pulmonary:     Effort: Pulmonary effort is normal.     Breath sounds: Normal breath sounds.  Abdominal:     General: Bowel sounds are normal.     Palpations: Abdomen is soft. There is no hepatomegaly or splenomegaly.     Comments: Obese abdomen  Musculoskeletal: Normal range of motion.     Right lower leg: Edema (trace) present.     Left lower leg: Edema (trace) present.  Skin:    General: Skin is warm and dry.     Capillary Refill: Capillary refill takes less than 2 seconds.     Findings: No rash.  Neurological:     Mental Status: He is alert and oriented to person, place, and time.     Deep Tendon Reflexes: Reflexes are normal and symmetric.  Psychiatric:        Mood and Affect: Mood normal.        Behavior: Behavior normal.        Thought Content: Thought content normal.        Judgment: Judgment normal.     Results for orders placed or performed in visit on 08/23/17  CBC with Differential/Platelet  Result Value Ref Range   WBC 9.0 3.4 - 10.8 x10E3/uL   RBC 5.38 4.14 - 5.80 x10E6/uL   Hemoglobin 15.8 13.0 - 17.7 g/dL   Hematocrit 16.1 09.6 - 51.0 %   MCV 86 79 - 97 fL   MCH 29.4 26.6 - 33.0 pg   MCHC 34.1 31.5 - 35.7 g/dL   RDW 04.5 40.9 - 81.1 %   Platelets 193 150 - 450 x10E3/uL   Neutrophils 58 Not Estab. %   Lymphs 33 Not Estab. %   Monocytes 5 Not Estab. %   Eos 4 Not Estab. %   Basos 0 Not Estab. %   Neutrophils Absolute 5.2 1.4 - 7.0 x10E3/uL   Lymphocytes Absolute 3.0 0.7 - 3.1 x10E3/uL   Monocytes Absolute 0.4 0.1 - 0.9 x10E3/uL   EOS (ABSOLUTE) 0.4 0.0 - 0.4 x10E3/uL   Basophils Absolute 0.0 0.0 - 0.2 x10E3/uL   Immature Granulocytes 0 Not Estab. %   Immature Grans (Abs) 0.0 0.0 - 0.1 x10E3/uL  Comprehensive metabolic panel  Result Value Ref Range   Glucose 71 65 - 99 mg/dL   BUN 13 6 - 20 mg/dL   Creatinine, Ser 9.14 0.76 - 1.27 mg/dL   GFR calc non Af Amer 106 >59 mL/min/1.73   GFR calc Af Amer 123 >59 mL/min/1.73   BUN/Creatinine Ratio 14 9 -  20   Sodium 142 134 - 144 mmol/L   Potassium 3.9 3.5 - 5.2 mmol/L   Chloride 102 96 - 106 mmol/L   CO2 26 20 - 29 mmol/L   Calcium 9.7 8.7 - 10.2 mg/dL   Total Protein  7.1 6.0 - 8.5 g/dL   Albumin 4.7 3.5 - 5.5 g/dL   Globulin, Total 2.4 1.5 - 4.5 g/dL   Albumin/Globulin Ratio 2.0 1.2 - 2.2   Bilirubin Total 0.5 0.0 - 1.2 mg/dL   Alkaline Phosphatase 79 39 - 117 IU/L   AST 32 0 - 40 IU/L   ALT 42 0 - 44 IU/L  Lipid Panel w/o Chol/HDL Ratio  Result Value Ref Range   Cholesterol, Total 181 100 - 199 mg/dL   Triglycerides 409227 (H) 0 - 149 mg/dL   HDL 34 (L) >81>39 mg/dL   VLDL Cholesterol Cal 45 (H) 5 - 40 mg/dL   LDL Calculated 191102 (H) 0 - 99 mg/dL  TSH  Result Value Ref Range   TSH 2.250 0.450 - 4.500 uIU/mL  Bayer DCA Hb A1c Waived  Result Value Ref Range   HB A1C (BAYER DCA - WAIVED) 5.3 <7.0 %  Uric acid  Result Value Ref Range   Uric Acid 5.2 3.7 - 8.6 mg/dL      Assessment & Plan:   Problem List Items Addressed This Visit      Cardiovascular and Mediastinum   Hypertension - Primary    Chronic, stable.  BP below goal today.  Continue current regimen.        Other   Gout    Chronic, stable on Allopurinol.  Continue current medication regimen.  PRN script for indomethacin sent to pharmacy, he is aware to only use if needed and to notify provider if any flares present.      Hyperlipidemia    No current medications.  Discussed at length low cholesterol diet regimen.  CMP and lipid panel today.      Relevant Orders   Lipid Panel w/o Chol/HDL Ratio   Comprehensive metabolic panel   Extreme obesity    Discussed at length healthy diet and exercise regimen.          Follow up plan: Return in about 3 months (around 05/30/2018) for HLD and weight loss.

## 2018-02-28 NOTE — Assessment & Plan Note (Signed)
Discussed at length healthy diet and exercise regimen.

## 2018-02-28 NOTE — Assessment & Plan Note (Signed)
Chronic, stable.  BP below goal today.  Continue current regimen.

## 2018-02-28 NOTE — Assessment & Plan Note (Signed)
Chronic, stable on Allopurinol.  Continue current medication regimen.  PRN script for indomethacin sent to pharmacy, he is aware to only use if needed and to notify provider if any flares present.

## 2018-02-28 NOTE — Patient Instructions (Signed)
Start by cutting back on soda and sweet tea intake, little bit at a time.  Start by cutting back to a few days a week and then two days a week....etc..  Diabetes Mellitus and Nutrition When you have diabetes (diabetes mellitus), it is very important to have healthy eating habits because your blood sugar (glucose) levels are greatly affected by what you eat and drink. Eating healthy foods in the appropriate amounts, at about the same times every day, can help you:  Control your blood glucose.  Lower your risk of heart disease.  Improve your blood pressure.  Reach or maintain a healthy weight.  Every person with diabetes is different, and each person has different needs for a meal plan. Your health care provider may recommend that you work with a diet and nutrition specialist (dietitian) to make a meal plan that is best for you. Your meal plan may vary depending on factors such as:  The calories you need.  The medicines you take.  Your weight.  Your blood glucose, blood pressure, and cholesterol levels.  Your activity level.  Other health conditions you have, such as heart or kidney disease.  How do carbohydrates affect me? Carbohydrates affect your blood glucose level more than any other type of food. Eating carbohydrates naturally increases the amount of glucose in your blood. Carbohydrate counting is a method for keeping track of how many carbohydrates you eat. Counting carbohydrates is important to keep your blood glucose at a healthy level, especially if you use insulin or take certain oral diabetes medicines. It is important to know how many carbohydrates you can safely have in each meal. This is different for every person. Your dietitian can help you calculate how many carbohydrates you should have at each meal and for snack. Foods that contain carbohydrates include:  Bread, cereal, rice, pasta, and crackers.  Potatoes and corn.  Peas, beans, and lentils.  Milk and  yogurt.  Fruit and juice.  Desserts, such as cakes, cookies, ice cream, and candy.  How does alcohol affect me? Alcohol can cause a sudden decrease in blood glucose (hypoglycemia), especially if you use insulin or take certain oral diabetes medicines. Hypoglycemia can be a life-threatening condition. Symptoms of hypoglycemia (sleepiness, dizziness, and confusion) are similar to symptoms of having too much alcohol. If your health care provider says that alcohol is safe for you, follow these guidelines:  Limit alcohol intake to no more than 1 drink per day for nonpregnant women and 2 drinks per day for men. One drink equals 12 oz of beer, 5 oz of wine, or 1 oz of hard liquor.  Do not drink on an empty stomach.  Keep yourself hydrated with water, diet soda, or unsweetened iced tea.  Keep in mind that regular soda, juice, and other mixers may contain a lot of sugar and must be counted as carbohydrates.  What are tips for following this plan? Reading food labels  Start by checking the serving size on the label. The amount of calories, carbohydrates, fats, and other nutrients listed on the label are based on one serving of the food. Many foods contain more than one serving per package.  Check the total grams (g) of carbohydrates in one serving. You can calculate the number of servings of carbohydrates in one serving by dividing the total carbohydrates by 15. For example, if a food has 30 g of total carbohydrates, it would be equal to 2 servings of carbohydrates.  Check the number of grams (g)  of saturated and trans fats in one serving. Choose foods that have low or no amount of these fats.  Check the number of milligrams (mg) of sodium in one serving. Most people should limit total sodium intake to less than 2,300 mg per day.  Always check the nutrition information of foods labeled as "low-fat" or "nonfat". These foods may be higher in added sugar or refined carbohydrates and should be  avoided.  Talk to your dietitian to identify your daily goals for nutrients listed on the label. Shopping  Avoid buying canned, premade, or processed foods. These foods tend to be high in fat, sodium, and added sugar.  Shop around the outside edge of the grocery store. This includes fresh fruits and vegetables, bulk grains, fresh meats, and fresh dairy. Cooking  Use low-heat cooking methods, such as baking, instead of high-heat cooking methods like deep frying.  Cook using healthy oils, such as olive, canola, or sunflower oil.  Avoid cooking with butter, cream, or high-fat meats. Meal planning  Eat meals and snacks regularly, preferably at the same times every day. Avoid going long periods of time without eating.  Eat foods high in fiber, such as fresh fruits, vegetables, beans, and whole grains. Talk to your dietitian about how many servings of carbohydrates you can eat at each meal.  Eat 4-6 ounces of lean protein each day, such as lean meat, chicken, fish, eggs, or tofu. 1 ounce is equal to 1 ounce of meat, chicken, or fish, 1 egg, or 1/4 cup of tofu.  Eat some foods each day that contain healthy fats, such as avocado, nuts, seeds, and fish. Lifestyle   Check your blood glucose regularly.  Exercise at least 30 minutes 5 or more days each week, or as told by your health care provider.  Take medicines as told by your health care provider.  Do not use any products that contain nicotine or tobacco, such as cigarettes and e-cigarettes. If you need help quitting, ask your health care provider.  Work with a Veterinary surgeon or diabetes educator to identify strategies to manage stress and any emotional and social challenges. What are some questions to ask my health care provider?  Do I need to meet with a diabetes educator?  Do I need to meet with a dietitian?  What number can I call if I have questions?  When are the best times to check my blood glucose? Where to find more  information:  American Diabetes Association: diabetes.org/food-and-fitness/food  Academy of Nutrition and Dietetics: https://www.vargas.com/  [employer] of Diabetes and Digestive and Kidney Diseases (NIH): FindJewelers.cz Summary  A healthy meal plan will help you control your blood glucose and maintain a healthy lifestyle.  Working with a diet and nutrition specialist (dietitian) can help you make a meal plan that is best for you.  Keep in mind that carbohydrates and alcohol have immediate effects on your blood glucose levels. It is important to count carbohydrates and to use alcohol carefully. This information is not intended to replace advice given to you by your health care provider. Make sure you discuss any questions you have with your health care provider. Document Released: 11/30/2004 Document Revised: 04/09/2016 Document Reviewed: 04/09/2016 Elsevier Interactive Patient Education  2018 [employer]. Fat and Cholesterol Restricted Diet Getting too much fat and cholesterol in your diet may cause health problems. Following this diet helps keep your fat and cholesterol at normal levels. This can keep you from getting sick. What types of fat should I choose?  Choose monosaturated and polyunsaturated fats. These are found in foods such as olive oil, canola oil, flaxseeds, walnuts, almonds, and seeds.  Eat more omega-3 fats. Good choices include salmon, mackerel, sardines, tuna, flaxseed oil, and ground flaxseeds.  Limit saturated fats. These are in animal products such as meats, butter, and cream. They can also be in plant products such as palm oil, palm kernel oil, and coconut oil.  Avoid foods with partially hydrogenated oils in them. These contain trans fats. Examples of foods that have trans fats are stick margarine, some tub margarines, cookies, crackers, and  other baked goods. What general guidelines do I need to follow?  Check food labels. Look for the words "trans fat" and "saturated fat."  When preparing a meal: ? Fill half of your plate with vegetables and green salads. ? Fill one fourth of your plate with whole grains. Look for the word "whole" as the first word in the ingredient list. ? Fill one fourth of your plate with lean protein foods.  Eat more foods that have fiber, like apples, carrots, beans, peas, and barley.  Eat more home-cooked foods. Eat less at restaurants and buffets.  Limit or avoid alcohol.  Limit foods high in starch and sugar.  Limit fried foods.  Cook foods without frying them. Baking, boiling, grilling, and broiling are all great options.  Lose weight if you are overweight. Losing even a small amount of weight can help your overall health. It can also help prevent diseases such as diabetes and heart disease. What foods can I eat? Grains Whole grains, such as whole wheat or whole grain breads, crackers, cereals, and pasta. Unsweetened oatmeal, bulgur, barley, quinoa, or brown rice. Corn or whole wheat flour tortillas. Vegetables Fresh or frozen vegetables (raw, steamed, roasted, or grilled). Green salads. Fruits All fresh, canned (in natural juice), or frozen fruits. Meat and Other Protein Products Ground beef (85% or leaner), grass-fed beef, or beef trimmed of fat. Skinless chicken or [place]. Ground chicken or [place]. Pork trimmed of fat. All fish and seafood. Eggs. Dried beans, peas, or lentils. Unsalted nuts or seeds. Unsalted canned or dry beans. Dairy Low-fat dairy products, such as skim or 1% milk, 2% or reduced-fat cheeses, low-fat ricotta or cottage cheese, or plain low-fat yogurt. Fats and Oils Tub margarines without trans fats. Light or reduced-fat mayonnaise and salad dressings. Avocado. Olive, canola, sesame, or safflower oils. Natural peanut or almond butter (choose ones without added sugar and  oil). The items listed above may not be a complete list of recommended foods or beverages. Contact your dietitian for more options. What foods are not recommended? Grains White bread. White pasta. White rice. Cornbread. Bagels, pastries, and croissants. Crackers that contain trans fat. Vegetables White potatoes. Corn. Creamed or fried vegetables. Vegetables in a cheese sauce. Fruits Dried fruits. Canned fruit in light or heavy syrup. Fruit juice. Meat and Other Protein Products Fatty cuts of meat. Ribs, chicken wings, bacon, sausage, bologna, salami, chitterlings, fatback, hot dogs, bratwurst, and packaged luncheon meats. Liver and organ meats. Dairy Whole or 2% milk, cream, half-and-half, and cream cheese. Whole milk cheeses. Whole-fat or sweetened yogurt. Full-fat cheeses. Nondairy creamers and whipped toppings. Processed cheese, cheese spreads, or cheese curds. Sweets and Desserts Corn syrup, sugars, honey, and molasses. Candy. Jam and jelly. Syrup. Sweetened cereals. Cookies, pies, cakes, donuts, muffins, and ice cream. Fats and Oils Butter, stick margarine, lard, shortening, ghee, or bacon fat. Coconut, palm kernel, or palm oils. Beverages Alcohol. Sweetened drinks (such as  sodas, lemonade, and fruit drinks or punches). The items listed above may not be a complete list of foods and beverages to avoid. Contact your dietitian for more information. This information is not intended to replace advice given to you by your health care provider. Make sure you discuss any questions you have with your health care provider. Document Released: 09/04/2011 Document Revised: 11/10/2015 Document Reviewed: 06/04/2013 Elsevier Interactive Patient Education  [employer]2018 Elsevier Inc.

## 2018-03-01 LAB — COMPREHENSIVE METABOLIC PANEL
ALT: 37 IU/L (ref 0–44)
AST: 23 IU/L (ref 0–40)
Albumin/Globulin Ratio: 1.6 (ref 1.2–2.2)
Albumin: 4.3 g/dL (ref 3.5–5.5)
Alkaline Phosphatase: 88 IU/L (ref 39–117)
BUN/Creatinine Ratio: 13 (ref 9–20)
BUN: 14 mg/dL (ref 6–20)
Bilirubin Total: 0.4 mg/dL (ref 0.0–1.2)
CALCIUM: 9.4 mg/dL (ref 8.7–10.2)
CO2: 24 mmol/L (ref 20–29)
CREATININE: 1.05 mg/dL (ref 0.76–1.27)
Chloride: 98 mmol/L (ref 96–106)
GFR calc Af Amer: 105 mL/min/{1.73_m2} (ref >59)
GFR, EST NON AFRICAN AMERICAN: 91 mL/min/{1.73_m2} (ref >59)
GLOBULIN, TOTAL: 2.7 g/dL (ref 1.5–4.5)
GLUCOSE: 72 mg/dL (ref 65–99)
Potassium: 4.2 mmol/L (ref 3.5–5.2)
Sodium: 141 mmol/L (ref 134–144)
Total Protein: 7 g/dL (ref 6.0–8.5)

## 2018-03-01 LAB — LIPID PANEL W/O CHOL/HDL RATIO
Cholesterol, Total: 172 mg/dL (ref 100–199)
HDL: 29 mg/dL — ABNORMAL LOW (ref >39)
LDL Calculated: 100 mg/dL — ABNORMAL HIGH (ref 0–99)
Triglycerides: 215 mg/dL — ABNORMAL HIGH (ref 0–149)
VLDL CHOLESTEROL CAL: 43 mg/dL — AB (ref 5–40)

## 2018-03-10 ENCOUNTER — Other Ambulatory Visit: Payer: Self-pay | Admitting: Unknown Physician Specialty

## 2018-03-10 ENCOUNTER — Other Ambulatory Visit: Payer: Self-pay | Admitting: Nurse Practitioner

## 2018-05-29 ENCOUNTER — Encounter: Payer: Self-pay | Admitting: Nurse Practitioner

## 2018-05-30 ENCOUNTER — Encounter: Payer: Self-pay | Admitting: Nurse Practitioner

## 2018-05-30 ENCOUNTER — Ambulatory Visit: Payer: BLUE CROSS/BLUE SHIELD | Admitting: Nurse Practitioner

## 2018-05-30 ENCOUNTER — Other Ambulatory Visit: Payer: Self-pay

## 2018-05-30 DIAGNOSIS — R7301 Impaired fasting glucose: Secondary | ICD-10-CM

## 2018-05-30 DIAGNOSIS — E782 Mixed hyperlipidemia: Secondary | ICD-10-CM

## 2018-05-30 DIAGNOSIS — I10 Essential (primary) hypertension: Secondary | ICD-10-CM

## 2018-05-30 NOTE — Assessment & Plan Note (Signed)
No current medications, diet focused.  Lipid panel today, has not ate. 

## 2018-05-30 NOTE — Assessment & Plan Note (Signed)
Chronic, ongoing.  Continue current medication regimen.  Initial BP slightly elevated, but improved with repeat.

## 2018-05-30 NOTE — Progress Notes (Signed)
BP 128/82 (BP Location: Left Arm, Patient Position: Sitting)   Pulse 68   Temp 98 F (36.7 C)   Ht 5' 11.5" (1.816 m)   Wt (!) 385 lb 2 oz (174.7 kg)   SpO2 95%   BMI 52.97 kg/m    Subjective:    Patient ID: Brian Thornton, male    DOB: 1981/01/25, 38 y.o.   MRN: [id number]  HPI: Brian Thornton is a 38 y.o. male  Chief Complaint  Patient presents with  . Hypertension  . Weight Check    Lost 14 pounds since December    HYPERTENSION / HYPERLIPIDEMIA Continues on Lisinopril for HTN and is focused on diet weight loss at home.  No current statin. Satisfied with current treatment? yes Duration of hypertension: chronic BP monitoring frequency: not checking BP range:  BP medication side effects: no Duration of hyperlipidemia: chronic Aspirin: no Recent stressors: no Recurrent headaches: no Visual changes: no Palpitations: no Dyspnea: no Chest pain: no Lower extremity edema: no Dizzy/lightheaded: no   IFG: Last A1C in June 2019 5.3%.  He denies any polyuria, polydipsia, or polyphagia.  No current medications.  WEIGHT LOSS: Has lost 14 pounds since last visit.  Has stopped drinking soda and is eating more nutritious meals.  Currently no exercise regimen, but is walking.   Relevant past medical, surgical, family and social history reviewed and updated as indicated. Interim medical history since our last visit reviewed. Allergies and medications reviewed and updated.  Review of Systems  Constitutional: Negative for activity change, diaphoresis, fatigue and fever.  Respiratory: Negative for cough, chest tightness, shortness of breath and wheezing.   Cardiovascular: Negative for chest pain, palpitations and leg swelling.  Gastrointestinal: Negative for abdominal distention, abdominal pain, constipation, diarrhea, nausea and vomiting.  Endocrine: Negative for cold intolerance, heat intolerance, polydipsia, polyphagia and polyuria.  Musculoskeletal: Negative.   Skin:  Negative.   Neurological: Negative for dizziness, syncope, weakness, light-headedness, numbness and headaches.  Psychiatric/Behavioral: Negative.     Per HPI unless specifically indicated above     Objective:    BP 128/82 (BP Location: Left Arm, Patient Position: Sitting)   Pulse 68   Temp 98 F (36.7 C)   Ht 5' 11.5" (1.816 m)   Wt (!) 385 lb 2 oz (174.7 kg)   SpO2 95%   BMI 52.97 kg/m   Wt Readings from Last 3 Encounters:  05/30/18 (!) 385 lb 2 oz (174.7 kg)  02/28/18 (!) 396 lb 9.6 oz (179.9 kg)  08/23/17 (!) 380 lb (172.4 kg)    Physical Exam Vitals signs and nursing note reviewed.  Constitutional:      General: He is awake.     Appearance: He is well-developed. He is obese.  HENT:     Head: Normocephalic and atraumatic.     Right Ear: Hearing normal. No drainage.     Left Ear: Hearing normal. No drainage.     Mouth/Throat:     Pharynx: Uvula midline.  Eyes:     General: Lids are normal.        Right eye: No discharge.        Left eye: No discharge.     Conjunctiva/sclera: Conjunctivae normal.     Pupils: Pupils are equal, round, and reactive to light.  Neck:     Musculoskeletal: Normal range of motion and neck supple.     Thyroid: No thyromegaly.     Vascular: No carotid bruit or JVD.  Trachea: Trachea normal.  Cardiovascular:     Rate and Rhythm: Normal rate and regular rhythm.     Heart sounds: Normal heart sounds, S1 normal and S2 normal. No murmur. No gallop.   Pulmonary:     Effort: Pulmonary effort is normal.     Breath sounds: Normal breath sounds.  Abdominal:     General: Bowel sounds are normal.     Palpations: Abdomen is soft. There is no hepatomegaly or splenomegaly.  Musculoskeletal: Normal range of motion.     Right lower leg: No edema.     Left lower leg: No edema.  Skin:    General: Skin is warm and dry.     Capillary Refill: Capillary refill takes less than 2 seconds.     Findings: No rash.  Neurological:     Mental Status: He is  alert and oriented to person, place, and time.     Deep Tendon Reflexes: Reflexes are normal and symmetric.  Psychiatric:        Mood and Affect: Mood normal.        Behavior: Behavior normal. Behavior is cooperative.        Thought Content: Thought content normal.        Judgment: Judgment normal.     Results for orders placed or performed in visit on 02/28/18  Lipid Panel w/o Chol/HDL Ratio  Result Value Ref Range   Cholesterol, Total 172 100 - 199 mg/dL   Triglycerides 453 (H) 0 - 149 mg/dL   HDL 29 (L) >64 mg/dL   VLDL Cholesterol Cal 43 (H) 5 - 40 mg/dL   LDL Calculated 680 (H) 0 - 99 mg/dL  Comprehensive metabolic panel  Result Value Ref Range   Glucose 72 65 - 99 mg/dL   BUN 14 6 - 20 mg/dL   Creatinine, Ser 3.21 0.76 - 1.27 mg/dL   GFR calc non Af Amer 91 >59 mL/min/1.73   GFR calc Af Amer 105 >59 mL/min/1.73   BUN/Creatinine Ratio 13 9 - 20   Sodium 141 134 - 144 mmol/L   Potassium 4.2 3.5 - 5.2 mmol/L   Chloride 98 96 - 106 mmol/L   CO2 24 20 - 29 mmol/L   Calcium 9.4 8.7 - 10.2 mg/dL   Total Protein 7.0 6.0 - 8.5 g/dL   Albumin 4.3 3.5 - 5.5 g/dL   Globulin, Total 2.7 1.5 - 4.5 g/dL   Albumin/Globulin Ratio 1.6 1.2 - 2.2   Bilirubin Total 0.4 0.0 - 1.2 mg/dL   Alkaline Phosphatase 88 39 - 117 IU/L   AST 23 0 - 40 IU/L   ALT 37 0 - 44 IU/L      Assessment & Plan:   Problem List Items Addressed This Visit      Cardiovascular and Mediastinum   Hypertension    Chronic, ongoing.  Continue current medication regimen.  Initial BP slightly elevated, but improved with repeat.        Endocrine   IFG (impaired fasting glucose)    Check A1C today.      Relevant Orders   HgB A1c     Other   Hyperlipidemia    No current medications, diet focused.  Lipid panel today, has not ate.      Relevant Orders   Lipid Panel w/o Chol/HDL Ratio   Morbid obesity (HCC) - Primary    Recommend continued focus on health diet choices and regular physical activity (30  minutes 5 days a week).  Has  lost 14 pounds since last visit, praised for this.  Goal now is to lose 1-2 pounds a week over the next 6 months.          Follow up plan: Return in about 6 months (around 11/30/2018) for HTN and obesity.

## 2018-05-30 NOTE — Patient Instructions (Signed)

## 2018-05-30 NOTE — Assessment & Plan Note (Signed)
Recommend continued focus on health diet choices and regular physical activity (30 minutes 5 days a week).  Has lost 14 pounds since last visit, praised for this.  Goal now is to lose 1-2 pounds a week over the next 6 months.

## 2018-05-30 NOTE — Assessment & Plan Note (Signed)
Check A1C today

## 2018-05-31 LAB — LIPID PANEL W/O CHOL/HDL RATIO
Cholesterol, Total: 152 mg/dL (ref 100–199)
HDL: 31 mg/dL — AB (ref >39)
LDL CALC: 97 mg/dL (ref 0–99)
TRIGLYCERIDES: 119 mg/dL (ref 0–149)
VLDL CHOLESTEROL CAL: 24 mg/dL (ref 5–40)

## 2018-05-31 LAB — HEMOGLOBIN A1C
Est. average glucose Bld gHb Est-mCnc: 126 mg/dL
Hgb A1c MFr Bld: 6 % — ABNORMAL HIGH (ref 4.8–5.6)

## 2018-08-15 ENCOUNTER — Other Ambulatory Visit: Payer: Self-pay | Admitting: Unknown Physician Specialty

## 2018-10-15 ENCOUNTER — Ambulatory Visit
Admission: EM | Admit: 2018-10-15 | Discharge: 2018-10-15 | Disposition: A | Discharge status: Home / Self Care | Payer: BC Managed Care – PPO | Attending: Family Medicine | Admitting: Family Medicine

## 2018-10-15 ENCOUNTER — Other Ambulatory Visit: Payer: Self-pay

## 2018-10-15 ENCOUNTER — Encounter: Payer: Self-pay | Admitting: Emergency Medicine

## 2018-10-15 ENCOUNTER — Ambulatory Visit (INDEPENDENT_AMBULATORY_CARE_PROVIDER_SITE_OTHER): Payer: BC Managed Care – PPO

## 2018-10-15 DIAGNOSIS — M795 Residual foreign body in soft tissue: Secondary | ICD-10-CM | POA: Diagnosis not present

## 2018-10-15 DIAGNOSIS — M25571 Pain in right ankle and joints of right foot: Secondary | ICD-10-CM

## 2018-10-15 DIAGNOSIS — M79671 Pain in right foot: Secondary | ICD-10-CM | POA: Diagnosis not present

## 2018-10-15 DIAGNOSIS — Z8739 Personal history of other diseases of the musculoskeletal system and connective tissue: Secondary | ICD-10-CM | POA: Diagnosis not present

## 2018-10-15 DIAGNOSIS — M7731 Calcaneal spur, right foot: Secondary | ICD-10-CM | POA: Diagnosis not present

## 2018-10-15 DIAGNOSIS — M7661 Achilles tendinitis, right leg: Secondary | ICD-10-CM

## 2018-10-15 DIAGNOSIS — L509 Urticaria, unspecified: Secondary | ICD-10-CM

## 2018-10-15 MED ORDER — PREDNISONE 10 MG PO TABS: ORAL_TABLET | ORAL | 0 refills | Status: DC

## 2018-10-15 MED ORDER — FAMOTIDINE 20 MG PO TABS: 20.0000 mg | ORAL_TABLET | Freq: Two times a day (BID) | ORAL | 0 refills | Status: DC

## 2018-10-15 NOTE — Discharge Instructions (Signed)
Take medication as prescribed. Rest. Drink plenty of fluids.  Take over-the-counter Claritin or Zyrtec daily for the next 1 to 2 weeks.  Ice to right heel.  Stretch.  Avoid frequent stairs, climbing.  Follow-up with podiatry in 1 week as needed for continued pain.  Follow up with your primary care physician this week as needed. Return to Urgent care for new or worsening concerns.

## 2018-10-15 NOTE — ED Triage Notes (Signed)
Patient c/o right ankle pain and swelling that started yesterday. He states he woke up this morning with a rash on his arms and hands and swelling in his hands.

## 2018-10-15 NOTE — ED Provider Notes (Addendum)
[facility] ____________________________________________  Time seen: Approximately 9:13 AM  I have reviewed the triage vital signs and the nursing notes.   HISTORY  Chief Complaint Ankle Pain and Rash   HPI Quenten Ravenhillip G Delker is a 38 y.o. male presenting for evaluation of right ankle pain as well as rash.  Patient reports he has been having intermittent posterior ankle pain to the right ankle, and sometimes to the left ankle for the last for 5 days.  States no pain to the left ankle at this time.  States pain to the right ankle is worse first thing in the morning or after he sits for a while.  States it feels like a tightness.  Denies pain radiation, paresthesias, decreased range of motion.  Denies known injury or fall.  Does stand on his feet a lot.  No recent increase of steps or climbing.  No change in activity levels.  Has taken occasional ibuprofen which helps some.  No skin changes to that area.  Patient also reports having an itchy rash that started last night into this morning.  States rash initially was on his right forearm and right abdomen but has moved some.  States he took Benadryl this morning which helped calm rash down.  States rash is itchy, not painful.  Denies any known changes in foods, medicines, lotions, detergents or contacts.  Denies known trigger.  States hands feel tight near where the rash is on his forearms.  No lip, tongue or oral pharyngeal edema, difficulty swallowing, shortness of breath.  No recent cough, fevers, chest pain, or sickness.  Had a tick bite a few weeks ago, but reports no rash or abnormal changes around tick bite site.  Was stung by wasp last week but did not have any issues right away.  No other changes.  Marjie Skiffannady, Jolene T, NP: PCP  Past Medical History:  Diagnosis Date  . Dysmetabolic syndrome   . Extreme obesity   . Gout   . Hyperlipidemia   . Hypertension   . IFG (impaired fasting glucose)     Patient Active Problem List   Diagnosis Date Noted  . Eczema 07/09/2016  . Family history of colon cancer 02/21/2015  . Hypertension 09/24/2014  . Gout 09/24/2014  . Hyperlipidemia 09/24/2014  . IFG (impaired fasting glucose) 09/24/2014  . Morbid obesity (HCC) 09/24/2014  . Hyperuricemia 09/24/2014  . History of colon polyps 08/21/2013    History reviewed. No pertinent surgical history.   No current facility-administered medications for this encounter.   Current Outpatient Medications:  .  allopurinol (ZYLOPRIM) 300 MG tablet, Take 2 tablets (600 mg total) daily by mouth., Disp: 180 tablet, Rfl: 0 .  indomethacin (INDOCIN) 50 MG capsule, Take 1 capsule (50 mg total) by mouth 3 (three) times daily as needed(for gout flare only)., Disp: 30 capsule, Rfl: 3 .  lisinopril (PRINIVIL,ZESTRIL) 40 MG tablet, Take 1 tablet (40 mg total) by mouth daily., Disp: 90 tablet, Rfl: 1 .  famotidine (PEPCID) 20 MG tablet, Take 1 tablet (20 mg total) by mouth 2 (two) times daily for 5 days., Disp: 10 tablet, Rfl: 0 .  predniSONE (DELTASONE) 10 MG tablet, Start 60 mg po day one, then 50 mg po day two, taper by 10 mg daily until complete., Disp: 21 tablet, Rfl: 0  Allergies Febuxostat  Family History  Problem Relation Age of Onset  . Diabetes Father   . Gout Brother   . Heart attack Maternal Grandmother   . Cancer Maternal  Grandfather        colon  . Cancer Paternal Grandfather        prostate    Social History Social History   Tobacco Use  . Smoking status: Never Smoker  . Smokeless tobacco: Never Used  Substance Use Topics  . Alcohol use: No    Alcohol/week: 0.0 standard drinks  . Drug use: No    Review of Systems Constitutional: No fever ENT: No sore throat. Cardiovascular: Denies chest pain. Respiratory: Denies shortness of breath. Gastrointestinal: No abdominal pain.  Genitourinary: Negative for dysuria. Musculoskeletal: Positive right ankle pain. Skin: Positive for rash. Neurological: Negative for  headaches, focal weakness or numbness.   ____________________________________________   PHYSICAL EXAM:  VITAL SIGNS: ED Triage Vitals  Enc Vitals Group     BP 10/15/18 0840 114/66     Pulse Rate 10/15/18 0840 79     Resp 10/15/18 0840 18     Temp 10/15/18 0840 98.4 F (36.9 C)     Temp Source 10/15/18 0840 Oral     SpO2 10/15/18 0840 98 %     Weight 10/15/18 0840 (!) 400 lb (181.4 kg)     Height 10/15/18 0840 6\' 1"  (1.854 m)     Head Circumference --      Peak Flow --      Pain Score 10/15/18 0837 5     Pain Loc --      Pain Edu? --      Excl. in [place]? --     Constitutional: Alert and oriented. Well appearing and in no acute distress. Eyes: Conjunctivae are normal.  ENT      Head: Normocephalic and atraumatic. Cardiovascular: Normal rate, regular rhythm. Grossly normal heart sounds.  Good peripheral circulation. Respiratory: Normal respiratory effort without tachypnea nor retractions. Breath sounds are clear and equal bilaterally. No wheezes, rales, rhonchi. Musculoskeletal:  Bilateral pedal pulses equal and easily palpated.   Except : Right posterior heel at distal Achilles and Achilles insertion tenderness to direct palpation, no other Achilles tenderness, able to fully plantarflex and dorsiflex with minimal discomfort, no pain to calf, right foot otherwise nontender. neurologic:  Normal speech and language. Speech is normal. No gait instability.  Skin:  Skin is warm, dry.  Except: Mildly erythematous hives to bilateral forearms, torso and right inner leg, pruritic, nontender, no drainage, no surrounding erythema. Psychiatric: Mood and affect are normal. Speech and behavior are normal. Patient exhibits appropriate insight and judgment   ___________________________________________   LABS (all labs ordered are listed, but only abnormal results are displayed)  Labs Reviewed - No data to display ____________________________________________  RADIOLOGY  Dg Ankle Complete  Right  Result Date: 10/15/2018 CLINICAL DATA:  Heel pain for 4 days. History of gout. No known injury. EXAM: RIGHT ANKLE - COMPLETE 3+ VIEW COMPARISON:  None. FINDINGS: The mineralization and alignment are normal. There is no evidence of acute fracture or dislocation. There is mild spurring of both malleoli. There is a probable osteochondral lesion of the talar dome laterally. Calcaneal spurring is noted without adjacent focal soft tissue abnormality. The soft tissues are diffusely prominent. There is a small metallic pellet within the soft tissues of the proximal forefoot laterally. IMPRESSION: No acute osseous findings or clear explanation for the patient's symptoms. Calcaneal spurring. Probable osteochondral lesion of the talar dome laterally. Electronically Signed   By: Richardean Sale M.D.   On: 10/15/2018 09:37   ____________________________________________   PROCEDURES   INITIAL IMPRESSION / ASSESSMENT AND PLAN /  ED COURSE  Pertinent labs & imaging results that were available during my care of the patient were reviewed by me and considered in my medical decision making (see chart for details).  Well-appearing patient.  No acute distress.  Suspect Achilles tendinitis.  No injury or trauma.  Full range of motion present.  Right ankle x-ray as above per radiologist, no acute osseous findings, probable osteochondral lesion, discussed this in detail with patient.  Recommend ice, rest, supportive care.  Patient states he will rested prior to utilizing a boot.  Work note given for today and tomorrow.  Patient also with hives, unclear etiology.  Will treat with prednisone, Pepcid and recommend over-the-counter Claritin or Zyrtec.  Monitor for triggers.Discussed indication, risks and benefits of medications with patient.  Follow-up with podiatry as needed.  Discussed follow up with Primary care physician this week. Discussed follow up and return parameters including no resolution or any worsening  concerns. Patient verbalized understanding and agreed to plan.   ____________________________________________   FINAL CLINICAL IMPRESSION(S) / ED DIAGNOSES  Final diagnoses:  Right Achilles tendinitis  Hives     ED Discharge Orders         Ordered    predniSONE (DELTASONE) 10 MG tablet     10/15/18 0943    famotidine (PEPCID) 20 MG tablet  2 times daily     10/15/18 [id number]           Note: This dictation was prepared with Dragon dictation along with smaller phrase technology. Any transcriptional errors that result from this process are unintentional.        Renford DillsMiller, Dustine Stickler, NP 10/15/18 [phone]

## 2018-11-02 ENCOUNTER — Ambulatory Visit (INDEPENDENT_AMBULATORY_CARE_PROVIDER_SITE_OTHER): Payer: BC Managed Care – PPO

## 2018-11-02 ENCOUNTER — Ambulatory Visit
Admission: EM | Admit: 2018-11-02 | Discharge: 2018-11-02 | Disposition: A | Discharge status: Home / Self Care | Payer: BC Managed Care – PPO | Attending: Family Medicine | Admitting: Family Medicine

## 2018-11-02 DIAGNOSIS — W11XXXA Fall on and from ladder, initial encounter: Secondary | ICD-10-CM

## 2018-11-02 DIAGNOSIS — M79662 Pain in left lower leg: Secondary | ICD-10-CM

## 2018-11-02 DIAGNOSIS — M25572 Pain in left ankle and joints of left foot: Secondary | ICD-10-CM

## 2018-11-02 DIAGNOSIS — S99912A Unspecified injury of left ankle, initial encounter: Secondary | ICD-10-CM | POA: Diagnosis not present

## 2018-11-02 DIAGNOSIS — S82832A Other fracture of upper and lower end of left fibula, initial encounter for closed fracture: Secondary | ICD-10-CM

## 2018-11-02 NOTE — ED Triage Notes (Signed)
Pt fell off a 24ft ladder yesterday and injured his left leg. Having pain from the left knee down to his ankle. States his leg got caught in the ladder. Hurts even when sitting still. No visible cuts or bruising.

## 2018-11-02 NOTE — ED Provider Notes (Signed)
[facility] ____________________________________________  Time seen: Approximately 10:36 AM  I have reviewed the triage vital signs and the nursing notes.   HISTORY  Chief Complaint Fall  HPI Quenten Ravenhillip G Sedivy is a 38 y.o. male presenting for evaluation of left lower leg pain after injury that occurred yesterday.  Patient reports that he was near the top of 8 foot ladder helping his brother-in-law, and reports the ladder shifted causing him to fall.  States that he slid down the ladder but at the very end his leg got caught up in the step and twisted his left ankle.  States he has continued remain ambulatory.  Did take over-the-counter Aleve yesterday which helped.  States pain is mild currently.  States pain is worse with ambulating.  States pain is to the lateral shin and the medial ankle.  Denies knee pain, foot pain, neck or back pain.  Denies head injury or loss of consciousness.  Reports otherwise doing well.  No recent cough, fevers, chest pain or shortness of breath.  Marjie Skiffannady, Jolene T, NP: PCP    Past Medical History:  Diagnosis Date  . Dysmetabolic syndrome   . Extreme obesity   . Gout   . Hyperlipidemia   . Hypertension   . IFG (impaired fasting glucose)     Patient Active Problem List   Diagnosis Date Noted  . Eczema 07/09/2016  . Family history of colon cancer 02/21/2015  . Hypertension 09/24/2014  . Gout 09/24/2014  . Hyperlipidemia 09/24/2014  . IFG (impaired fasting glucose) 09/24/2014  . Morbid obesity (HCC) 09/24/2014  . Hyperuricemia 09/24/2014  . History of colon polyps 08/21/2013    History reviewed. No pertinent surgical history.   No current facility-administered medications for this encounter.   Current Outpatient Medications:  .  allopurinol (ZYLOPRIM) 300 MG tablet, Take 2 tablets (600 mg total) daily by mouth., Disp: 180 tablet, Rfl: 0 .  indomethacin (INDOCIN) 50 MG capsule, Take 1 capsule (50 mg total) by mouth 3 (three) times  daily as needed(for gout flare only)., Disp: 30 capsule, Rfl: 3 .  lisinopril (PRINIVIL,ZESTRIL) 40 MG tablet, Take 1 tablet (40 mg total) by mouth daily., Disp: 90 tablet, Rfl: 1 .  famotidine (PEPCID) 20 MG tablet, Take 1 tablet (20 mg total) by mouth 2 (two) times daily for 5 days., Disp: 10 tablet, Rfl: 0 .  predniSONE (DELTASONE) 10 MG tablet, Start 60 mg po day one, then 50 mg po day two, taper by 10 mg daily until complete., Disp: 21 tablet, Rfl: 0  Allergies Febuxostat  Family History  Problem Relation Age of Onset  . Diabetes Father   . Gout Brother   . Heart attack Maternal Grandmother   . Cancer Maternal Grandfather        colon  . Cancer Paternal Grandfather        prostate    Social History Social History   Tobacco Use  . Smoking status: Never Smoker  . Smokeless tobacco: Never Used  Substance Use Topics  . Alcohol use: No    Alcohol/week: 0.0 standard drinks  . Drug use: No    Review of Systems Constitutional: No fever ENT: No sore throat. Cardiovascular: Denies chest pain. Respiratory: Denies shortness of breath. Musculoskeletal: Positive left leg pain.  Skin: Negative for cuts.  ____________________________________________   PHYSICAL EXAM:  VITAL SIGNS: ED Triage Vitals [11/02/18 1014]  Enc Vitals Group     BP (!) 142/97     Pulse Rate 86  Resp 18     Temp 98.5 F (36.9 C)     Temp Source Oral     SpO2 99 %     Weight (!) 400 lb (181.4 kg)     Height      Head Circumference      Peak Flow      Pain Score 5     Pain Loc      Pain Edu?      Excl. in [place]?     Constitutional: Alert and oriented. Well appearing and in no acute distress. Eyes: Conjunctivae are normal.  ENT      Head: Normocephalic and atraumatic. Cardiovascular: Normal rate, regular rhythm. Grossly normal heart sounds.  Good peripheral circulation. Respiratory: Normal respiratory effort without tachypnea nor retractions. Breath sounds are clear and equal bilaterally. No  wheezes, rales, rhonchi. Musculoskeletal: Bilateral pedal pulses equal and easily palpated. Except: Left lateral mid to proximal tibia tenderness to palpation, no swelling, no ecchymosis, knee nontender, able to fully extend and flex knee.  Left medial malleolus mild to moderate tenderness to palpation with mild edema, pain with ankle rotation, able to fully plantarflex and dorsiflex left foot, left lower extremity otherwise nontender.  Ambulatory with mild antalgic gait. Neurologic:  Normal speech and language. Speech is normal.  Skin:  Skin is warm, dry. Psychiatric: Mood and affect are normal. Speech and behavior are normal. Patient exhibits appropriate insight and judgment   ___________________________________________   LABS (all labs ordered are listed, but only abnormal results are displayed)  Labs Reviewed - No data to display  RADIOLOGY  Dg Tibia/fibula Left  Result Date: 11/02/2018 CLINICAL DATA:  Fall off 8 ft ladder yesterday. Left leg pain. Initial encounter. EXAM: LEFT TIBIA AND FIBULA - 2 VIEW COMPARISON:  None. FINDINGS: Comminuted, nondisplaced fracture of the proximal fibula is seen. No other fractures identified. IMPRESSION: Comminuted nondisplaced proximal fibular fracture. Electronically Signed   By: Danae OrleansJohn A Stahl M.D.   On: 11/02/2018 11:05   Dg Ankle Complete Left  Result Date: 11/02/2018 CLINICAL DATA:  Larey SeatFell off 8 ft ladder yesterday. Left ankle injury and pain. Initial encounter. EXAM: LEFT ANKLE COMPLETE - 3+ VIEW COMPARISON:  None. FINDINGS: There is no evidence of fracture, dislocation, or joint effusion. There is no evidence of arthropathy. Small plantar and dorsal calcaneal bone spurs are incidentally noted. IMPRESSION: No acute findings. Electronically Signed   By: Danae OrleansJohn A Stahl M.D.   On: 11/02/2018 11:06   ____________________________________________   PROCEDURES Procedures    INITIAL IMPRESSION / ASSESSMENT AND PLAN / ED COURSE  Pertinent labs & imaging  results that were available during my care of the patient were reviewed by me and considered in my medical decision making (see chart for details).  Well-appearing patient.  No acute distress.  Left leg pain post mechanical injury that occurred yesterday.  X-rays as above per radiologist, reviewed by myself.  Left ankle x-ray no acute.  Left tib-fib x-ray comminuted nondisplaced proximal fibular fracture.  Called and discussed with orthopedic on-call Dr. Joice LoftsPoggi.  Posterior left leg with sugar tong splint applied and crutches given.  Directed to remain nonweightbearing.  Orthopedic request for patient to remain n.p.o. tonight after midnight and follow-up tomorrow, calling first thing in the morning and they will see him.  Patient agreed to this plan.  Patient declined need for prescription medication.   Discussed follow up and return parameters including no resolution or any worsening concerns. Patient verbalized understanding and agreed to plan.  ____________________________________________   FINAL CLINICAL IMPRESSION(S) / ED DIAGNOSES  Final diagnoses:  Closed fracture of proximal end of left fibula, unspecified fracture morphology, initial encounter  Acute left ankle pain     ED Discharge Orders    None       Note: This dictation was prepared with Dragon dictation along with smaller phrase technology. Any transcriptional errors that result from this process are unintentional.         Marylene Land, NP 11/02/18 1227

## 2018-11-02 NOTE — Discharge Instructions (Addendum)
Keep in splint. NO weight bearing.  Ice and elevate.  Follow-up with orthopedic tomorrow morning, call first thing.  Nothing to eat or drink past midnight tonight.  Follow up with your primary care physician this week as needed. Return to Urgent care for new or worsening concerns.

## 2018-11-03 ENCOUNTER — Ambulatory Visit: Payer: BC Managed Care – PPO | Admitting: Anesthesiology

## 2018-11-03 ENCOUNTER — Encounter: Payer: Self-pay | Admitting: *Deleted

## 2018-11-03 ENCOUNTER — Encounter
Admission: RE | Disposition: A | Discharge status: Home / Self Care | Payer: Self-pay | Source: Ambulatory Visit | Attending: Surgery

## 2018-11-03 ENCOUNTER — Ambulatory Visit: Payer: BC Managed Care – PPO

## 2018-11-03 ENCOUNTER — Other Ambulatory Visit: Payer: Self-pay

## 2018-11-03 ENCOUNTER — Ambulatory Visit
Admission: RE | Admit: 2018-11-03 | Discharge: 2018-11-03 | Disposition: A | Discharge status: Home / Self Care | Payer: BC Managed Care – PPO | Source: Ambulatory Visit | Attending: Surgery | Admitting: Surgery

## 2018-11-03 ENCOUNTER — Other Ambulatory Visit
Admission: RE | Admit: 2018-11-03 | Discharge: 2018-11-03 | Disposition: A | Discharge status: Home / Self Care | Payer: BC Managed Care – PPO | Source: Ambulatory Visit | Attending: Orthopedic Surgery | Admitting: Orthopedic Surgery

## 2018-11-03 DIAGNOSIS — S82812A Torus fracture of upper end of left fibula, initial encounter for closed fracture: Secondary | ICD-10-CM | POA: Diagnosis not present

## 2018-11-03 DIAGNOSIS — S82892A Other fracture of left lower leg, initial encounter for closed fracture: Secondary | ICD-10-CM | POA: Diagnosis not present

## 2018-11-03 DIAGNOSIS — S93432A Sprain of tibiofibular ligament of left ankle, initial encounter: Secondary | ICD-10-CM | POA: Insufficient documentation

## 2018-11-03 DIAGNOSIS — E669 Obesity, unspecified: Secondary | ICD-10-CM | POA: Insufficient documentation

## 2018-11-03 DIAGNOSIS — M109 Gout, unspecified: Secondary | ICD-10-CM | POA: Diagnosis not present

## 2018-11-03 DIAGNOSIS — Z800 Family history of malignant neoplasm of digestive organs: Secondary | ICD-10-CM | POA: Diagnosis not present

## 2018-11-03 DIAGNOSIS — Z6841 Body Mass Index (BMI) 40.0 and over, adult: Secondary | ICD-10-CM | POA: Diagnosis not present

## 2018-11-03 DIAGNOSIS — S82862A Displaced Maisonneuve's fracture of left leg, initial encounter for closed fracture: Secondary | ICD-10-CM | POA: Diagnosis not present

## 2018-11-03 DIAGNOSIS — Z8349 Family history of other endocrine, nutritional and metabolic diseases: Secondary | ICD-10-CM | POA: Diagnosis not present

## 2018-11-03 DIAGNOSIS — W11XXXA Fall on and from ladder, initial encounter: Secondary | ICD-10-CM | POA: Diagnosis not present

## 2018-11-03 DIAGNOSIS — Z86010 Personal history of colonic polyps: Secondary | ICD-10-CM | POA: Diagnosis not present

## 2018-11-03 DIAGNOSIS — Z419 Encounter for procedure for purposes other than remedying health state, unspecified: Secondary | ICD-10-CM

## 2018-11-03 DIAGNOSIS — I10 Essential (primary) hypertension: Secondary | ICD-10-CM | POA: Insufficient documentation

## 2018-11-03 DIAGNOSIS — S82832A Other fracture of upper and lower end of left fibula, initial encounter for closed fracture: Secondary | ICD-10-CM | POA: Diagnosis not present

## 2018-11-03 DIAGNOSIS — E785 Hyperlipidemia, unspecified: Secondary | ICD-10-CM | POA: Insufficient documentation

## 2018-11-03 DIAGNOSIS — Z833 Family history of diabetes mellitus: Secondary | ICD-10-CM | POA: Insufficient documentation

## 2018-11-03 DIAGNOSIS — Y9389 Activity, other specified: Secondary | ICD-10-CM | POA: Insufficient documentation

## 2018-11-03 DIAGNOSIS — Z888 Allergy status to other drugs, medicaments and biological substances status: Secondary | ICD-10-CM | POA: Diagnosis not present

## 2018-11-03 DIAGNOSIS — K219 Gastro-esophageal reflux disease without esophagitis: Secondary | ICD-10-CM | POA: Insufficient documentation

## 2018-11-03 DIAGNOSIS — M25372 Other instability, left ankle: Secondary | ICD-10-CM | POA: Diagnosis not present

## 2018-11-03 DIAGNOSIS — Z79899 Other long term (current) drug therapy: Secondary | ICD-10-CM | POA: Insufficient documentation

## 2018-11-03 DIAGNOSIS — Z20828 Contact with and (suspected) exposure to other viral communicable diseases: Secondary | ICD-10-CM | POA: Insufficient documentation

## 2018-11-03 HISTORY — PX: ORIF ANKLE FRACTURE: SHX5408

## 2018-11-03 LAB — SARS CORONAVIRUS 2 BY RT PCR (HOSPITAL ORDER, PERFORMED IN ~~LOC~~ HOSPITAL LAB): SARS Coronavirus 2: NEGATIVE

## 2018-11-03 SURGERY — OPEN REDUCTION INTERNAL FIXATION (ORIF) ANKLE FRACTURE
Anesthesia: General | Laterality: Left

## 2018-11-03 MED ORDER — FENTANYL CITRATE (PF) 100 MCG/2ML IJ SOLN
INTRAMUSCULAR | Status: AC
  Filled 2018-11-03: qty 2

## 2018-11-03 MED ORDER — LIDOCAINE HCL (CARDIAC) PF 100 MG/5ML IV SOSY
PREFILLED_SYRINGE | INTRAVENOUS | Status: DC | PRN
  Administered 2018-11-03: 100 mg via INTRAVENOUS

## 2018-11-03 MED ORDER — OXYCODONE HCL 5 MG/5ML PO SOLN: 5.0000 mg | Freq: Once | ORAL | Status: AC | PRN

## 2018-11-03 MED ORDER — FAMOTIDINE 20 MG PO TABS
ORAL_TABLET | ORAL | Status: AC
  Administered 2018-11-03: 20 mg
  Filled 2018-11-03: qty 1

## 2018-11-03 MED ORDER — PHENYLEPHRINE HCL (PRESSORS) 10 MG/ML IV SOLN
INTRAVENOUS | Status: DC | PRN
  Administered 2018-11-03: 100 ug via INTRAVENOUS

## 2018-11-03 MED ORDER — OXYCODONE HCL 5 MG PO TABS
5.0000 mg | ORAL_TABLET | Freq: Once | ORAL | Status: AC | PRN
  Administered 2018-11-03: 5 mg via ORAL

## 2018-11-03 MED ORDER — MEPERIDINE HCL 50 MG/ML IJ SOLN: 6.2500 mg | INTRAMUSCULAR | Status: DC | PRN

## 2018-11-03 MED ORDER — LACTATED RINGERS IV SOLN
Freq: Once | INTRAVENOUS | Status: AC
  Administered 2018-11-03: 12:00:00 via INTRAVENOUS

## 2018-11-03 MED ORDER — PROPOFOL 10 MG/ML IV BOLUS
INTRAVENOUS | Status: DC | PRN
  Administered 2018-11-03: 200 mg via INTRAVENOUS
  Administered 2018-11-03: 50 mg via INTRAVENOUS
  Administered 2018-11-03: 40 mg via INTRAVENOUS

## 2018-11-03 MED ORDER — OXYCODONE HCL 5 MG PO TABS
ORAL_TABLET | ORAL | Status: AC
  Filled 2018-11-03: qty 1

## 2018-11-03 MED ORDER — FENTANYL CITRATE (PF) 100 MCG/2ML IJ SOLN
INTRAMUSCULAR | Status: DC | PRN
  Administered 2018-11-03 (×2): 50 ug via INTRAVENOUS

## 2018-11-03 MED ORDER — HYDROCODONE-ACETAMINOPHEN 5-325 MG PO TABS: 1.0000 | ORAL_TABLET | Freq: Four times a day (QID) | ORAL | 0 refills | Status: DC | PRN

## 2018-11-03 MED ORDER — BUPIVACAINE HCL (PF) 0.5 % IJ SOLN
INTRAMUSCULAR | Status: AC
  Filled 2018-11-03: qty 30

## 2018-11-03 MED ORDER — LACTATED RINGERS IV SOLN
INTRAVENOUS | Status: DC | PRN
  Administered 2018-11-03: 13:00:00 via INTRAVENOUS

## 2018-11-03 MED ORDER — BUPIVACAINE HCL 0.5 % IJ SOLN
INTRAMUSCULAR | Status: DC | PRN
  Administered 2018-11-03: 10 mL

## 2018-11-03 MED ORDER — DEXMEDETOMIDINE HCL IN NACL 200 MCG/50ML IV SOLN
INTRAVENOUS | Status: DC | PRN
  Administered 2018-11-03 (×3): 8 ug via INTRAVENOUS

## 2018-11-03 MED ORDER — FENTANYL CITRATE (PF) 100 MCG/2ML IJ SOLN
25.0000 ug | INTRAMUSCULAR | Status: DC | PRN
  Administered 2018-11-03: 50 ug via INTRAVENOUS

## 2018-11-03 MED ORDER — KETOROLAC TROMETHAMINE 30 MG/ML IJ SOLN
INTRAMUSCULAR | Status: DC | PRN
  Administered 2018-11-03: 30 mg via INTRAVENOUS

## 2018-11-03 MED ORDER — FENTANYL CITRATE (PF) 100 MCG/2ML IJ SOLN
INTRAMUSCULAR | Status: AC
  Administered 2018-11-03: 50 ug via INTRAVENOUS
  Filled 2018-11-03: qty 2

## 2018-11-03 MED ORDER — DEXTROSE 5 % IV SOLN
3.0000 g | Freq: Once | INTRAVENOUS | Status: AC
  Administered 2018-11-03: 3 g via INTRAVENOUS
  Filled 2018-11-03: qty 3

## 2018-11-03 MED ORDER — ONDANSETRON HCL 4 MG/2ML IJ SOLN
INTRAMUSCULAR | Status: AC
  Filled 2018-11-03: qty 2

## 2018-11-03 MED ORDER — ONDANSETRON HCL 4 MG/2ML IJ SOLN
INTRAMUSCULAR | Status: DC | PRN
  Administered 2018-11-03: 4 mg via INTRAVENOUS

## 2018-11-03 MED ORDER — SUCCINYLCHOLINE CHLORIDE 20 MG/ML IJ SOLN
INTRAMUSCULAR | Status: DC | PRN
  Administered 2018-11-03: 200 mg via INTRAVENOUS

## 2018-11-03 MED ORDER — PROMETHAZINE HCL 25 MG/ML IJ SOLN: 6.2500 mg | INTRAMUSCULAR | Status: DC | PRN

## 2018-11-03 SURGICAL SUPPLY — 48 items
BLADE SURG SZ10 CARB STEEL (BLADE) ×4 IMPLANT
BNDG COHESIVE 4X5 TAN STRL (GAUZE/BANDAGES/DRESSINGS) ×2 IMPLANT
BNDG ELASTIC 4X5.8 VLCR STR LF (GAUZE/BANDAGES/DRESSINGS) ×3 IMPLANT
BNDG ELASTIC 6X5.8 VLCR STR LF (GAUZE/BANDAGES/DRESSINGS) ×1 IMPLANT
BNDG ESMARK 6X12 TAN STRL LF (GAUZE/BANDAGES/DRESSINGS) ×2 IMPLANT
BNDG PLASTER FAST 4X5 WHT LF (CAST SUPPLIES) ×4 IMPLANT
CANISTER SUCT 1200ML W/VALVE (MISCELLANEOUS) ×2 IMPLANT
CHLORAPREP W/TINT 26 (MISCELLANEOUS) ×3 IMPLANT
COVER WAND RF STERILE (DRAPES) ×2 IMPLANT
CUFF TOURN SGL QUICK 24 (TOURNIQUET CUFF) ×1
CUFF TOURN SGL QUICK 30 (TOURNIQUET CUFF)
CUFF TRNQT CYL 24X4X16.5-23 (TOURNIQUET CUFF) IMPLANT
CUFF TRNQT CYL 30X4X21-28X (TOURNIQUET CUFF) IMPLANT
DRAPE C-ARM XRAY 36X54 (DRAPES) ×2 IMPLANT
DRAPE C-ARMOR (DRAPES) ×2 IMPLANT
DRAPE INCISE IOBAN 66X45 STRL (DRAPES) ×2 IMPLANT
DRAPE SPLIT 6X30 W/TAPE (DRAPES) ×1 IMPLANT
DRAPE U-SHAPE 47X51 STRL (DRAPES) ×2 IMPLANT
ELECT CAUTERY BLADE 6.4 (BLADE) ×2 IMPLANT
ELECT REM PT RETURN 9FT ADLT (ELECTROSURGICAL) ×2
ELECTRODE REM PT RTRN 9FT ADLT (ELECTROSURGICAL) ×1 IMPLANT
FIXATION ZIPTIGHT ANKLE SNDSMS (Ankle) IMPLANT
GAUZE SPONGE 4X4 12PLY STRL (GAUZE/BANDAGES/DRESSINGS) ×2 IMPLANT
GAUZE XEROFORM 1X8 LF (GAUZE/BANDAGES/DRESSINGS) ×2 IMPLANT
GLOVE BIO SURGEON STRL SZ8 (GLOVE) ×4 IMPLANT
GLOVE INDICATOR 8.0 STRL GRN (GLOVE) ×2 IMPLANT
GOWN STRL REUS W/ TWL LRG LVL3 (GOWN DISPOSABLE) ×1 IMPLANT
GOWN STRL REUS W/ TWL XL LVL3 (GOWN DISPOSABLE) ×1 IMPLANT
GOWN STRL REUS W/TWL LRG LVL3 (GOWN DISPOSABLE) ×1
GOWN STRL REUS W/TWL XL LVL3 (GOWN DISPOSABLE) ×1
HEMOVAC 400ML (MISCELLANEOUS)
KIT DRAIN HEMOVAC JP 7FR 400ML (MISCELLANEOUS) ×1 IMPLANT
KIT TURNOVER KIT A (KITS) ×2 IMPLANT
LABEL OR SOLS (LABEL) ×2 IMPLANT
NS IRRIG 1000ML POUR BTL (IV SOLUTION) ×2 IMPLANT
PACK EXTREMITY ARMC (MISCELLANEOUS) ×2 IMPLANT
PAD ABD DERMACEA PRESS 5X9 (GAUZE/BANDAGES/DRESSINGS) ×2 IMPLANT
PAD CAST CTTN 4X4 STRL (SOFTGOODS) ×2 IMPLANT
PAD PREP 24X41 OB/GYN DISP (PERSONAL CARE ITEMS) ×2 IMPLANT
PADDING CAST COTTON 4X4 STRL (SOFTGOODS)
SPONGE LAP 18X18 RF (DISPOSABLE) ×2 IMPLANT
STAPLER SKIN PROX 35W (STAPLE) ×2 IMPLANT
STOCKINETTE IMPERV 14X48 (MISCELLANEOUS) ×2 IMPLANT
SUT VIC AB 0 CT1 36 (SUTURE) ×2 IMPLANT
SUT VIC AB 2-0 SH 27 (SUTURE) ×2
SUT VIC AB 2-0 SH 27XBRD (SUTURE) ×2 IMPLANT
SYR 10ML LL (SYRINGE) ×2 IMPLANT
ZIPTIGHT ANKLE SYNODESMOSS FIX (Ankle) ×4 IMPLANT

## 2018-11-03 NOTE — Op Note (Signed)
11/03/2018  1:58 PM  Patient:   Brian Thornton  Pre-Op Diagnosis:   Maisonneuve fracture with syndesmotic injury, left ankle.  Post-Op Diagnosis:   Same.  Procedure:   Syndesmotic stabilization, left ankle.  Surgeon:   Pascal Lux, MD  Assistant:   None  Anesthesia:   GET  Findings:   As above.  Complications:   None  EBL:   5 cc  Fluids:   500 cc crystalloid  UOP:   None  TT:   27 min at 250 mmHg  Drains:   None  Closure:   Staples  Implants:   Biomet ALPS 7-hole composite locking plate and screws  Brief Clinical Note:   The patient is a 38 year old male who sustained the above-noted injury 2 days ago when he fell approximately 8 feet from a ladder. As he hit the ground, his left ankle became entangled with the lower rung of a ladder, resulting in the above-noted injury. He was seen by urgent care the following day where x-rays demonstrated widening of the medial joint space and a proximal fibular fracture. The patient presents at this time for definitive management of his injury.  Procedure:   The patient was brought into the operating room and lain in the supine position. After adequate general endotracheal intubation and anesthesia was obtained, the left foot and lower leg were prepped with ChloraPrep solution, then draped sterilely. Preoperative antibiotics were administered. A timeout was performed to verify the appropriate surgical site before the limb was exsanguinated with an Esmarch and the calf tourniquet inflated to 250 mmHg.   A 2-3 cm incision was made over the lateral aspect of the distal fibula. The incision was carried down through the subcutaneous tissues to expose the lateral aspect of the distal fibula. Under C-arm guidance, the Zimmer Biomet ZipTight drill was passed across the distal fibula and across the distal tibia parallel to the mortise. The Zimmer Biomet ZipTight device was then passed through this hole and advanced to where the medial anchor  point could flip and capture the medial cortex. The lateral button was then tightened securely. Using the same technique and again under C-arm guidance, the Zimmer Biomet ZipTight drill was passed across the distal fibula and across the distal tibia parallel to the mortise. A second Zimmer Biomet ZipTight device was then passed through this hole and advanced to where the medial anchor point could flip and capture the medial cortex. The lateral button was then tightened securely.  Both lateral buttons were tightened as securely as possible before the sutures were removed.  The adequacy of mortise restoration and syndesmotic stabilization was verified in AP and lateral projections using the C arm and found to be anatomic.  The wound was copiously irrigated with sterile saline solution. The subcutaneous tissues were closed in two layers using 2-0 Vicryl interrupted sutures before the skin was closed using staples. A total of 10 cc of 0.5% plain Sensorcaine was injected in and around the incision sites to help with postoperative analgesia.  A sterile bulky dressing was applied to the wound before the patient was placed into a cam walker boot, maintaining the ankle in neutral dorsiflexion. The patient was then awakened, extubated, and returned to the recovery room in satisfactory condition after tolerating the procedure well.

## 2018-11-03 NOTE — Discharge Instructions (Addendum)
AMBULATORY SURGERY  DISCHARGE INSTRUCTIONS   1) The drugs that you were given will stay in your system until tomorrow so for the next 24 hours you should not:  A) Drive an automobile B) Make any legal decisions C) Drink any alcoholic beverage  2) You may resume regular meals tomorrow.  Today it is better to start with liquids and gradually work up to solid foods.  You may eat anything you prefer, but it is better to start with liquids, then soup and crackers, and gradually work up to solid foods. 3) Please notify your doctor immediately if you have any unusual bleeding, trouble breathing, redness and pain at the surgery site, drainage, fever, or pain not relieved by medication.  Please contact your physician with any problems or Same Day Surgery at [phone], Monday through Friday 6 am to 4 pm, or [place] at [facility] number at [phone].   Orthopedic discharge instructions: Keep dressing dry and intact.  May shower after dressing changed on post-op day #4 (Friday).  Cover staples with Band-Aids after drying off. Keep left leg elevated and apply ice frequently to ankle. (May loosen/remove boot to apply ice.) Take ibuprofen 800 mg TID with meals for 7-10 days, then as necessary. Take pain medication as prescribed and/or  ES Tylenol if necessary.  No weight-bearing on left leg - use crutches or walker. Follow-up in 10-14 days or as scheduled.

## 2018-11-03 NOTE — Anesthesia Procedure Notes (Addendum)
Procedure Name: Intubation Date/Time: 11/03/2018 1:16 PM Performed by: Jonna Clark, CRNA Pre-anesthesia Checklist: Patient identified, Emergency Drugs available, Suction available and Patient being monitored Patient Re-evaluated:Patient Re-evaluated prior to induction Oxygen Delivery Method: Circle system utilized Preoxygenation: Pre-oxygenation with 100% oxygen Induction Type: IV induction Ventilation: Mask ventilation without difficulty Tube type: Oral Number of attempts: 1 Airway Equipment and Method: Stylet and Oral airway Placement Confirmation: ETT inserted through vocal cords under direct vision,  positive ETCO2 and breath sounds checked- equal and bilateral Tube secured with: Tape Dental Injury: Teeth and Oropharynx as per pre-operative assessment

## 2018-11-03 NOTE — H&P (Signed)
Paper H&P to be scanned into permanent record. H&P reviewed and patient re-examined. No changes. 

## 2018-11-03 NOTE — Anesthesia Postprocedure Evaluation (Signed)
Anesthesia Post Note  Patient: Brian Thornton  Procedure(s) Performed: LEFT ANKLE STABILIZATION (Left )  Patient location during evaluation: PACU Anesthesia Type: General Level of consciousness: awake and alert and oriented Pain management: pain level controlled Vital Signs Assessment: post-procedure vital signs reviewed and stable Respiratory status: spontaneous breathing, nonlabored ventilation and respiratory function stable Cardiovascular status: blood pressure returned to baseline and stable Postop Assessment: no signs of nausea or vomiting Anesthetic complications: no     Last Vitals:  Vitals:   11/03/18 1459 11/03/18 1514  BP: 107/68 111/67  Pulse: 75 82  Resp: 12 15  Temp:    SpO2: 97% 96%    Last Pain:  Vitals:   11/03/18 1459  TempSrc:   PainSc: 3                  [name]

## 2018-11-03 NOTE — Transfer of Care (Signed)
Immediate Anesthesia Transfer of Care Note  Patient: Brian Thornton  Procedure(s) Performed: LEFT ANKLE STABILIZATION (Left )  Patient Location: PACU  Anesthesia Type:General  Level of Consciousness: awake, alert  and oriented  Airway & Oxygen Therapy: Patient Spontanous Breathing and Patient connected to face mask oxygen  Post-op Assessment: Report given to RN and Post -op Vital signs reviewed and stable  Post vital signs: Reviewed and stable  Last Vitals:  Vitals Value Taken Time  BP 90/45 11/03/18 1359  Temp    Pulse 90 11/03/18 1400  Resp 15 11/03/18 1400  SpO2 97 % 11/03/18 1400  Vitals shown include unvalidated device data.  Last Pain:  Vitals:   11/03/18 1100  TempSrc: Oral  PainSc: 6       Patients Stated Pain Goal: 3 (90/38/33 3832)  Complications: No apparent anesthesia complications

## 2018-11-03 NOTE — Anesthesia Preprocedure Evaluation (Signed)
Anesthesia Evaluation  Patient identified by MRN, date of birth, ID band Patient awake    Reviewed: Allergy & Precautions, NPO status , Patient's Chart, lab work & pertinent test results  History of Anesthesia Complications Negative for: history of anesthetic complications  Airway Mallampati: III  TM Distance: >3 FB Neck ROM: Full    Dental no notable dental hx.    Pulmonary neg pulmonary ROS, neg sleep apnea, neg COPD,    breath sounds clear to auscultation- rhonchi (-) wheezing      Cardiovascular hypertension, Pt. on medications (-) Past MI, (-) Cardiac Stents, (-) CABG and (-) Peripheral Vascular Disease  Rhythm:Regular Rate:Normal - Systolic murmurs and - Diastolic murmurs    Neuro/Psych neg Seizures negative neurological ROS  negative psych ROS   GI/Hepatic negative GI ROS, Neg liver ROS,   Endo/Other  negative endocrine ROSneg diabetes  Renal/GU negative Renal ROS     Musculoskeletal negative musculoskeletal ROS (+)   Abdominal (+) + obese,   Peds  Hematology negative hematology ROS (+)   Anesthesia Other Findings Past Medical History: No date: Dysmetabolic syndrome No date: Extreme obesity No date: Gout No date: Hyperlipidemia No date: Hypertension No date: IFG (impaired fasting glucose)   Reproductive/Obstetrics                             Anesthesia Physical Anesthesia Plan  ASA: II  Anesthesia Plan: General   Post-op Pain Management:    Induction: Intravenous  PONV Risk Score and Plan: 1  Airway Management Planned: Oral ETT  Additional Equipment:   Intra-op Plan:   Post-operative Plan: Extubation in OR  Informed Consent: I have reviewed the patients History and Physical, chart, labs and discussed the procedure including the risks, benefits and alternatives for the proposed anesthesia with the patient or authorized representative who has indicated his/her  understanding and acceptance.     Dental advisory given  Plan Discussed with: CRNA and Anesthesiologist  Anesthesia Plan Comments:         Anesthesia Quick Evaluation

## 2018-11-03 NOTE — OR Nursing (Signed)
Patient became pale, diaphoretic and was not feeling good, laid him back got ice cold towels and opened his fluids.  Patient is beginning to feel better and not as diaphoretic

## 2018-11-03 NOTE — Anesthesia Post-op Follow-up Note (Signed)
Anesthesia QCDR form completed.        

## 2018-11-17 DIAGNOSIS — S93432A Sprain of tibiofibular ligament of left ankle, initial encounter: Secondary | ICD-10-CM | POA: Diagnosis not present

## 2018-11-17 DIAGNOSIS — S82812A Torus fracture of upper end of left fibula, initial encounter for closed fracture: Secondary | ICD-10-CM | POA: Diagnosis not present

## 2018-12-05 ENCOUNTER — Ambulatory Visit (INDEPENDENT_AMBULATORY_CARE_PROVIDER_SITE_OTHER): Payer: Self-pay | Admitting: Nurse Practitioner

## 2018-12-05 ENCOUNTER — Other Ambulatory Visit: Payer: Self-pay

## 2018-12-05 ENCOUNTER — Encounter: Payer: Self-pay | Admitting: Nurse Practitioner

## 2018-12-05 DIAGNOSIS — E782 Mixed hyperlipidemia: Secondary | ICD-10-CM | POA: Diagnosis not present

## 2018-12-05 DIAGNOSIS — R3915 Urgency of urination: Secondary | ICD-10-CM | POA: Diagnosis not present

## 2018-12-05 DIAGNOSIS — I10 Essential (primary) hypertension: Secondary | ICD-10-CM

## 2018-12-05 DIAGNOSIS — R7301 Impaired fasting glucose: Secondary | ICD-10-CM

## 2018-12-05 DIAGNOSIS — N4830 Priapism, unspecified: Secondary | ICD-10-CM | POA: Insufficient documentation

## 2018-12-05 DIAGNOSIS — N4889 Other specified disorders of penis: Secondary | ICD-10-CM | POA: Insufficient documentation

## 2018-12-05 NOTE — Assessment & Plan Note (Signed)
Check A1C today, last was 6.0%.  Have recommended heavy focus on diet and weight loss due to prediabetes at this time.

## 2018-12-05 NOTE — Progress Notes (Signed)
BP 121/88   Pulse 78   Temp 98.2 F (36.8 C) (Oral)   Ht 5' 11.5" (1.816 m)   Wt (!) 401 lb (181.9 kg)   SpO2 96%   BMI 55.15 kg/m    Subjective:    Patient ID: Brian Thornton, male    DOB: 10-26-1980, 38 y.o.   MRN: [id number]  HPI: Brian Thornton is a 38 y.o. male  Chief Complaint  Patient presents with  . Hypertension    58mf/u  . Obesity   HYPERTENSION Continues on Lisinopril. Hypertension status: stable  Satisfied with current treatment? yes Duration of hypertension: chronic BP monitoring frequency:  not checking BP range:  BP medication side effects:  no Medication compliance: good compliance Aspirin: no Recurrent headaches: no Visual changes: no Palpitations: no Dyspnea: no Chest pain: no Lower extremity edema: no Dizzy/lightheaded: no   URINARY SYMPTOMS: He reports that when he tries to get erection the tip of his penis gets "a really tight ring around it" and can not get fully erect + gets sore like it is tearing.  He is circumcised reports the skin is falling over the area and is white in color.  Also reports he has been having issues with urgency, when he has to urinate he can not hold it.  Has to go "right then".  These symptoms have been going on for a few months.  The issues with erection started initially.  He is requesting a referral to urology for further assessment of these issues.  Is urinating normal amounts when he urinates.   Dysuria: no Urinary frequency: no Urgency: yes Small volume voids: no Urinary incontinence: no Foul odor: no Hematuria: no Abdominal pain: no Back pain: no Suprapubic pain/pressure: no   PREDIABETES: Last A1C 6.0%.  States he has not been following diet well over past months since he fractured leg when fell from ladder.   Polydipsia/polyuria: no Visual disturbance: no Chest pain: no Paresthesias: no  Relevant past medical, surgical, family and social history reviewed and updated as indicated. Interim  medical history since our last visit reviewed. Allergies and medications reviewed and updated.  Review of Systems  Constitutional: Negative for activity change, diaphoresis, fatigue and fever.  Respiratory: Negative for cough, chest tightness, shortness of breath and wheezing.   Cardiovascular: Negative for chest pain, palpitations and leg swelling.  Gastrointestinal: Negative for abdominal distention, abdominal pain, constipation, diarrhea, nausea and vomiting.  Endocrine: Negative for polydipsia, polyphagia and polyuria.  Genitourinary: Positive for penile pain (with erection only) and urgency.  Neurological: Negative for dizziness, syncope, weakness, light-headedness, numbness and headaches.  Psychiatric/Behavioral: Negative.     Per HPI unless specifically indicated above     Objective:    BP 121/88   Pulse 78   Temp 98.2 F (36.8 C) (Oral)   Ht 5' 11.5" (1.816 m)   Wt (!) 401 lb (181.9 kg)   SpO2 96%   BMI 55.15 kg/m   Wt Readings from Last 3 Encounters:  12/05/18 (!) 401 lb (181.9 kg)  11/03/18 (!) 400 lb (181.4 kg)  11/02/18 (!) 400 lb (181.4 kg)    Physical Exam Vitals signs and nursing note reviewed.  Constitutional:      General: He is awake. He is not in acute distress.    Appearance: He is well-developed. He is morbidly obese. He is not ill-appearing.  HENT:     Head: Normocephalic and atraumatic.     Right Ear: Hearing normal. No drainage.  Left Ear: Hearing normal. No drainage.  Eyes:     General: Lids are normal.        Right eye: No discharge.        Left eye: No discharge.     Conjunctiva/sclera: Conjunctivae normal.     Pupils: Pupils are equal, round, and reactive to light.  Neck:     Musculoskeletal: Normal range of motion and neck supple.     Thyroid: No thyromegaly.     Vascular: No carotid bruit.  Cardiovascular:     Rate and Rhythm: Normal rate and regular rhythm.     Heart sounds: Normal heart sounds, S1 normal and S2 normal. No  murmur. No gallop.   Pulmonary:     Effort: Pulmonary effort is normal. No accessory muscle usage or respiratory distress.     Breath sounds: Normal breath sounds.  Abdominal:     General: Bowel sounds are normal.     Palpations: Abdomen is soft.  Genitourinary:    Penis: No erythema, tenderness, discharge, swelling or lesions.      Scrotum/Testes: Normal.     Comments: Skin to penis folding downwards over glans with skin at tip whitish in color compared to remainder.  No erythema or discharge noted.  No tenderness.   Musculoskeletal: Normal range of motion.     Right lower leg: No edema.     Left lower leg: No edema.  Skin:    General: Skin is warm and dry.     Capillary Refill: Capillary refill takes less than 2 seconds.     Findings: No rash.  Neurological:     Mental Status: He is alert and oriented to person, place, and time.     Deep Tendon Reflexes: Reflexes are normal and symmetric.  Psychiatric:        Mood and Affect: Mood normal.        Behavior: Behavior normal. Behavior is cooperative.        Thought Content: Thought content normal.        Judgment: Judgment normal.     Results for orders placed or performed during the hospital encounter of 11/03/18  SARS Coronavirus 2 [facility] order, Performed in [facility] hospital lab) Nasopharyngeal Nasopharyngeal Swab   Specimen: Nasopharyngeal Swab  Result Value Ref Range   SARS Coronavirus 2 NEGATIVE NEGATIVE      Assessment & Plan:   Problem List Items Addressed This Visit      Cardiovascular and Mediastinum   Hypertension    Chronic, ongoing.  Continue current medication regimen.  BP at goal today.  CMP obtained.      Relevant Orders   Comp Met (CMET)     Endocrine   IFG (impaired fasting glucose)    Check A1C today, last was 6.0%.  Have recommended heavy focus on diet and weight loss due to prediabetes at this time.      Relevant Orders   HgB A1c     Other   Hyperlipidemia    No current medications,  diet focused.  Lipid panel today, has not ate.      Relevant Orders   Comp Met (CMET)   Lipid Panel w/o Chol/HDL Ratio   Morbid obesity ([place]) - Primary    Recommend continued focus on health diet choices and regular physical activity (30 minutes 5 days a week).  Goal now is to lose 1-2 pounds a week over the next 6 months.      Painful erection  Referral placed to urology for further assessment and recommendations.      Relevant Orders   Ambulatory referral to Urology   Urinary urgency    Repeat A1C today, last was 6.0%.  Have referral placed to urology for further assessment and recommendations.        Relevant Orders   PSA   Ambulatory referral to Urology       Follow up plan: Return in about 6 months (around 06/04/2019) for HTN, Prediabetes.

## 2018-12-05 NOTE — Assessment & Plan Note (Signed)
No current medications, diet focused.  Lipid panel today, has not ate.

## 2018-12-05 NOTE — Assessment & Plan Note (Signed)
Chronic, ongoing.  Continue current medication regimen.  BP at goal today.  CMP obtained.

## 2018-12-05 NOTE — Assessment & Plan Note (Signed)
Referral placed to urology for further assessment and recommendations.

## 2018-12-05 NOTE — Assessment & Plan Note (Addendum)
Recommend continued focus on health diet choices and regular physical activity (30 minutes 5 days a week).  Goal now is to lose 1-2 pounds a week over the next 6 months.

## 2018-12-05 NOTE — Patient Instructions (Signed)

## 2018-12-05 NOTE — Assessment & Plan Note (Signed)
Repeat A1C today, last was 6.0%.  Have referral placed to urology for further assessment and recommendations.

## 2018-12-06 LAB — COMPREHENSIVE METABOLIC PANEL
ALT: 34 IU/L (ref 0–44)
AST: 22 IU/L (ref 0–40)
Albumin/Globulin Ratio: 2 (ref 1.2–2.2)
Albumin: 4.6 g/dL (ref 4.0–5.0)
Alkaline Phosphatase: 118 IU/L — ABNORMAL HIGH (ref 39–117)
BUN/Creatinine Ratio: 7 — ABNORMAL LOW (ref 9–20)
BUN: 8 mg/dL (ref 6–20)
Bilirubin Total: 0.5 mg/dL (ref 0.0–1.2)
CO2: 23 mmol/L (ref 20–29)
Calcium: 9.6 mg/dL (ref 8.7–10.2)
Chloride: 101 mmol/L (ref 96–106)
Creatinine, Ser: 1.1 mg/dL (ref 0.76–1.27)
GFR calc Af Amer: 99 mL/min/{1.73_m2} (ref >59)
GFR calc non Af Amer: 85 mL/min/{1.73_m2} (ref >59)
Globulin, Total: 2.3 g/dL (ref 1.5–4.5)
Glucose: 97 mg/dL (ref 65–99)
Potassium: 3.8 mmol/L (ref 3.5–5.2)
Sodium: 144 mmol/L (ref 134–144)
Total Protein: 6.9 g/dL (ref 6.0–8.5)

## 2018-12-06 LAB — LIPID PANEL W/O CHOL/HDL RATIO
Cholesterol, Total: 173 mg/dL (ref 100–199)
HDL: 28 mg/dL — ABNORMAL LOW (ref >39)
LDL Chol Calc (NIH): 92 mg/dL (ref 0–99)
Triglycerides: 316 mg/dL — ABNORMAL HIGH (ref 0–149)
VLDL Cholesterol Cal: 53 mg/dL — ABNORMAL HIGH (ref 5–40)

## 2018-12-06 LAB — PSA: Prostate Specific Ag, Serum: 0.7 ng/mL (ref 0.0–4.0)

## 2018-12-06 LAB — HEMOGLOBIN A1C
Est. average glucose Bld gHb Est-mCnc: 151 mg/dL
Hgb A1c MFr Bld: 6.9 % — ABNORMAL HIGH (ref 4.8–5.6)

## 2018-12-08 ENCOUNTER — Other Ambulatory Visit: Payer: Self-pay | Admitting: Nurse Practitioner

## 2018-12-08 DIAGNOSIS — E119 Type 2 diabetes mellitus without complications: Secondary | ICD-10-CM

## 2018-12-08 MED ORDER — METFORMIN HCL 500 MG PO TABS: ORAL_TABLET | ORAL | 3 refills | Status: DC

## 2018-12-08 NOTE — Progress Notes (Signed)
Metformin order

## 2018-12-19 DIAGNOSIS — S93432D Sprain of tibiofibular ligament of left ankle, subsequent encounter: Secondary | ICD-10-CM | POA: Diagnosis not present

## 2018-12-19 DIAGNOSIS — S82812D Torus fracture of upper end of left fibula, subsequent encounter for fracture with routine healing: Secondary | ICD-10-CM | POA: Diagnosis not present

## 2018-12-22 ENCOUNTER — Other Ambulatory Visit: Payer: Self-pay | Admitting: *Deleted

## 2018-12-22 DIAGNOSIS — R350 Frequency of micturition: Secondary | ICD-10-CM

## 2018-12-23 ENCOUNTER — Other Ambulatory Visit
Admission: RE | Admit: 2018-12-23 | Discharge: 2018-12-23 | Disposition: A | Discharge status: Home / Self Care | Payer: BC Managed Care – PPO | Attending: Urology | Admitting: Urology

## 2018-12-23 ENCOUNTER — Encounter: Payer: Self-pay | Admitting: Urology

## 2018-12-23 ENCOUNTER — Ambulatory Visit: Payer: BC Managed Care – PPO | Admitting: Urology

## 2018-12-23 ENCOUNTER — Other Ambulatory Visit: Payer: Self-pay

## 2018-12-23 VITALS — BP 110/72 | HR 93 | Ht 72.0 in | Wt >= 6400 oz

## 2018-12-23 DIAGNOSIS — R350 Frequency of micturition: Secondary | ICD-10-CM

## 2018-12-23 DIAGNOSIS — N476 Balanoposthitis: Secondary | ICD-10-CM

## 2018-12-23 LAB — URINALYSIS, COMPLETE (UACMP) WITH MICROSCOPIC
Glucose, UA: NEGATIVE mg/dL
Hgb urine dipstick: NEGATIVE
Leukocytes,Ua: NEGATIVE
Nitrite: NEGATIVE
Protein, ur: 100 mg/dL — AB
RBC / HPF: NONE SEEN RBC/hpf (ref 0–5)
Specific Gravity, Urine: 1.025 (ref 1.005–1.030)
pH: 6 (ref 5.0–8.0)

## 2018-12-23 LAB — BLADDER SCAN AMB NON-IMAGING

## 2018-12-23 MED ORDER — NYSTATIN-TRIAMCINOLONE 100000-0.1 UNIT/GM-% EX OINT: 1.0000 "application " | TOPICAL_OINTMENT | Freq: Two times a day (BID) | CUTANEOUS | 0 refills | Status: DC

## 2018-12-23 NOTE — Patient Instructions (Signed)
Balanitis  Balanitis is swelling and irritation (inflammation) of the head of the penis (glans penis). The condition may also cause inflammation of the skin around the glans penis (foreskin) in men who have not been circumcised. It may develop because of an infection or another medical condition. Balanitis occurs most often among men who have not had their foreskin removed (uncircumcised men). Balanitis sometimes causes scarring of the penis or foreskin, which can require surgery. Untreated balanitis can increase the risk of penile cancer. What are the causes? Common causes of this condition include:  Poor personal hygiene, especially in uncircumcised men. Not cleaning the glans penis and foreskin well can result in buildup of bacteria, viruses, and yeast, which can lead to infection and inflammation.  Irritation and lack of air flow due to fluid (smegma) that can build up on the glans penis. Other causes include:  Chemical irritation from products such as soaps or shower gels (especially those that have fragrance), condoms, personal lubricants, petroleum jelly, spermicides, or fabric softeners.  Skin conditions, such as eczema, dermatitis, and psoriasis.  Allergies to medicines, such as tetracycline and sulfa drugs.  Certain medical conditions, including liver cirrhosis, congestive heart failure, diabetes, and kidney disease.  Infections, such as candidiasis, HPV (human papillomavirus), herpes simplex, gonorrhea, and syphilis.  Severe obesity. What increases the risk? The following factors may make you more likely to develop this condition:  Having diabetes. This is the most common risk factor.  Having a tight foreskin that is difficult to pull back (retract) past the glans.  Having sexual intercourse without using a condom. What are the signs or symptoms? Symptoms of this condition include:  Discharge from under the foreskin.  A bad smell.  Pain or difficulty retracting the  foreskin.  Tenderness, redness, and swelling of the glans.  A rash or sores on the glans or foreskin.  Itchiness.  Inability to get an erection due to pain.  Difficulty urinating.  Scarring of the penis or foreskin, in some cases. How is this diagnosed? This condition may be diagnosed based on:  A physical exam.  Testing a swab of discharge to check for bacterial or fungal infection.  Blood tests: ? To check for viruses that can cause balanitis. ? To check your blood sugar (glucose) level. High blood glucose could be a sign of diabetes, which can cause balanitis. How is this treated? Treatment for balanitis depends on the cause. Treatment may include:  Improving personal hygiene. Your health care provider may recommend sitting in a bath of warm water that is deep enough to cover your hips and buttocks (sitz bath).  Medicines such as: ? Creams or ointments to reduce swelling (steroids) or to treat an infection. ? Antibiotic medicine. ? Antifungal medicine.  Surgery to remove or cut the foreskin (circumcision). This may be done if you have scarring on the foreskin that makes it difficult to retract.  Controlling other medical problems that may be causing your condition or making it worse. Follow these instructions at home:  Do not have sex until the condition clears up, or until your health care provider approves.  Keep your penis clean and dry. Take sitz baths as recommended by your health care provider.  Avoid products that irritate your skin or make symptoms worse, such as soaps and shower gels that have fragrance.  Take over-the-counter and prescription medicines only as told by your health care provider. ? If you were prescribed an antibiotic medicine or a cream or ointment, use it as   told by your health care provider. Do not stop using your medicine, cream, or ointment even if you start to feel better. ? Do not drive or use heavy machinery while taking prescription  pain medicine. Contact a health care provider if:  Your symptoms get worse or do not improve with home care.  You develop chills or a fever.  You have trouble urinating.  You cannot retract your foreskin. Get help right away if:  You develop severe pain.  You are unable to urinate. Summary  Balanitis is inflammation of the head of the penis (glans penis) caused by irritation or infection.  Balanitis causes pain, redness, and swelling of the glans penis.  This condition is most common among uncircumcised men who do not keep their glans penis clean and in men who have diabetes.  Treatment may include creams or ointments.  Good hygiene is important for prevention. This includes pulling back the foreskin when washing your penis. This information is not intended to replace advice given to you by your health care provider. Make sure you discuss any questions you have with your health care provider. Document Released: 07/22/2008 Document Revised: 02/15/2017 Document Reviewed: 01/23/2016 Elsevier Patient Education  2020 [employer]  

## 2018-12-23 NOTE — Progress Notes (Signed)
12/23/18 3:32 PM   Brian Thornton 11/10/1980 [id number]  Referring provider: Marjie Skiff, NP [address] [place],  [place] [address]  CC: Urinary urgency/frequency, penile pain  HPI: I saw [name] in urology clinic in consultation for urinary urgency and frequency, as well as penile pain with erections.  He is a 38 year old male with morbid obesity and BMI 54 recently diagnosed with diabetes and hemoglobin A1c of 7 who presents with 6 to 12 months of worsening urinary symptoms of urgency and frequency as well as penile pain with erections.  He does drink sodas and diet sodas during the day.  He has had one episode of urge incontinence.  He denies any weak stream.  He reports he is circumcised, but can no longer see the head of the penis, and the skin does not retract.  He has some irritation at the head of the penis and pain with erections.  He has never tried any medications for this.  There are no aggravating or alleviating factors.  Severity is moderate.  He denies any history of UTIs or gross hematuria.   PMH: Past Medical History:  Diagnosis Date  . Dysmetabolic syndrome   . Extreme obesity   . Gout   . Hyperlipidemia   . Hypertension   . IFG (impaired fasting glucose)     Surgical History: Past Surgical History:  Procedure Laterality Date  . ORIF ANKLE FRACTURE Left 11/03/2018   Procedure: LEFT ANKLE STABILIZATION;  Surgeon: Christena Flake, MD;  Location: [facility] ORS;  Service: Orthopedics;  Laterality: Left;    Allergies:  Allergies  Allergen Reactions  . Febuxostat Other (See Comments)    Caused more flares    Family History: Family History  Problem Relation Age of Onset  . Diabetes Father   . Gout Brother   . Heart attack Maternal Grandmother   . Cancer Maternal Grandfather        colon  . Cancer Paternal Grandfather        prostate    Social History:  reports that he has never smoked. He has never used smokeless tobacco. He reports that he does not  drink alcohol or use drugs.  ROS: Please see flowsheet from today's date for complete review of systems.  Physical Exam: BP 110/72   Pulse 93   Ht 6' (1.829 m)   Wt (!) 400 lb (181.4 kg)   BMI 54.25 kg/m    Constitutional:  Alert and oriented, No acute distress. Cardiovascular: No clubbing, cyanosis, or edema. Respiratory: Normal respiratory effort, no increased work of breathing. 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 Skin: No rashes, bruises or suspicious lesions. Neurologic: Grossly intact, no focal deficits, moving all 4 extremities. Psychiatric: Normal mood and affect.  Assessment & Plan:   In summary, the patient is a 38 year old male with morbid obesity and a BMI 54 who presents with urinary urgency and frequency, and penile pain with erections.  On exam he appears to have balanitis which is likely the source of both of his symptoms.  I was very frank with the patient that weight loss will likely help his bladder symptoms, as well as the ability to see the penis and retract the foreskin.  I recommended a course of Mycolog cream for his likely balanitis.  I  would be very hesitant to recommend any medications for his bladder symptoms at this point, and we discussed behavioral strategies at length.  Finally, if he continues to have pain with erections and buried penis, he could consider referral to Dr. Francesca Jewett at [facility] for surgical evaluation.  -Trial of Mycolog cream for balanitis -Discussed the importance of weight loss and hygiene and behavioral strategies for urinary symptoms and penile irritation -RTC 6 weeks for symptom check  [name], MD  [facility] [address], [place], [place] [address] [phone]

## 2018-12-24 NOTE — Addendum Note (Signed)
Addended by: Tommy Rainwater on: 12/24/2018 10:05 AM   Modules accepted: Orders

## 2018-12-25 LAB — URINE CULTURE: Culture: NO GROWTH

## 2018-12-30 ENCOUNTER — Other Ambulatory Visit: Payer: Self-pay | Admitting: Nurse Practitioner

## 2019-01-02 ENCOUNTER — Other Ambulatory Visit: Payer: Self-pay

## 2019-01-02 ENCOUNTER — Encounter: Payer: Self-pay | Admitting: Nurse Practitioner

## 2019-01-02 ENCOUNTER — Ambulatory Visit (INDEPENDENT_AMBULATORY_CARE_PROVIDER_SITE_OTHER): Payer: BC Managed Care – PPO | Admitting: Nurse Practitioner

## 2019-01-02 DIAGNOSIS — E119 Type 2 diabetes mellitus without complications: Secondary | ICD-10-CM

## 2019-01-02 MED ORDER — BLOOD GLUCOSE MONITOR KIT: PACK | 0 refills | Status: DC

## 2019-01-02 NOTE — Patient Instructions (Signed)
Goal blood sugars == in morning fasting < 130, After meals <180, A1C goal when check < 7   Carbohydrate Counting for Diabetes Mellitus, Adult  Carbohydrate counting is a method of keeping track of how many carbohydrates you eat. Eating carbohydrates naturally increases the amount of sugar (glucose) in the blood. Counting how many carbohydrates you eat helps keep your blood glucose within normal limits, which helps you manage your diabetes (diabetes mellitus). It is important to know how many carbohydrates you can safely have in each meal. This is different for every person. A diet and nutrition specialist (registered dietitian) can help you make a meal plan and calculate how many carbohydrates you should have at each meal and snack. Carbohydrates are found in the following foods:  Grains, such as breads and cereals.  Dried beans and soy products.  Starchy vegetables, such as potatoes, peas, and corn.  Fruit and fruit juices.  Milk and yogurt.  Sweets and snack foods, such as cake, cookies, candy, chips, and soft drinks. How do I count carbohydrates? There are two ways to count carbohydrates in food. You can use either of the methods or a combination of both. Reading "Nutrition Facts" on packaged food The "Nutrition Facts" list is included on the labels of almost all packaged foods and beverages in the [place] It includes:  The serving size.  Information about nutrients in each serving, including the grams (g) of carbohydrate per serving. To use the "Nutrition Facts":  Decide how many servings you will have.  Multiply the number of servings by the number of carbohydrates per serving.  The resulting number is the total amount of carbohydrates that you will be having. Learning standard serving sizes of other foods When you eat carbohydrate foods that are not packaged or do not include "Nutrition Facts" on the label, you need to measure the servings in order to count the amount of  carbohydrates:  Measure the foods that you will eat with a food scale or measuring cup, if needed.  Decide how many standard-size servings you will eat.  Multiply the number of servings by 15. Most carbohydrate-rich foods have about 15 g of carbohydrates per serving. ? For example, if you eat 8 oz (170 g) of strawberries, you will have eaten 2 servings and 30 g of carbohydrates (2 servings x 15 g = 30 g).  For foods that have more than one food mixed, such as soups and casseroles, you must count the carbohydrates in each food that is included. The following list contains standard serving sizes of common carbohydrate-rich foods. Each of these servings has about 15 g of carbohydrates:   hamburger bun or  English muffin.   oz (15 mL) syrup.   oz (14 g) jelly.  1 slice of bread.  1 six-inch tortilla.  3 oz (85 g) cooked rice or pasta.  4 oz (113 g) cooked dried beans.  4 oz (113 g) starchy vegetable, such as peas, corn, or potatoes.  4 oz (113 g) hot cereal.  4 oz (113 g) mashed potatoes or  of a large baked potato.  4 oz (113 g) canned or frozen fruit.  4 oz (120 mL) fruit juice.  4-6 crackers.  6 chicken nuggets.  6 oz (170 g) unsweetened dry cereal.  6 oz (170 g) plain fat-free yogurt or yogurt sweetened with artificial sweeteners.  8 oz (240 mL) milk.  8 oz (170 g) fresh fruit or one small piece of fruit.  24 oz (680 g)  popped popcorn. Example of carbohydrate counting Sample meal  3 oz (85 g) chicken breast.  6 oz (170 g) brown rice.  4 oz (113 g) corn.  8 oz (240 mL) milk.  8 oz (170 g) strawberries with sugar-free whipped topping. Carbohydrate calculation 1. Identify the foods that contain carbohydrates: ? Rice. ? Corn. ? Milk. ? Strawberries. 2. Calculate how many servings you have of each food: ? 2 servings rice. ? 1 serving corn. ? 1 serving milk. ? 1 serving strawberries. 3. Multiply each number of servings by 15 g: ? 2 servings rice  x 15 g = 30 g. ? 1 serving corn x 15 g = 15 g. ? 1 serving milk x 15 g = 15 g. ? 1 serving strawberries x 15 g = 15 g. 4. Add together all of the amounts to find the total grams of carbohydrates eaten: ? 30 g + 15 g + 15 g + 15 g = 75 g of carbohydrates total. Summary  Carbohydrate counting is a method of keeping track of how many carbohydrates you eat.  Eating carbohydrates naturally increases the amount of sugar (glucose) in the blood.  Counting how many carbohydrates you eat helps keep your blood glucose within normal limits, which helps you manage your diabetes.  A diet and nutrition specialist (registered dietitian) can help you make a meal plan and calculate how many carbohydrates you should have at each meal and snack. This information is not intended to replace advice given to you by your health care provider. Make sure you discuss any questions you have with your health care provider. Document Released: 03/05/2005 Document Revised: 09/27/2016 Document Reviewed: 08/17/2015 Elsevier Patient Education  2020 [employer].

## 2019-01-02 NOTE — Assessment & Plan Note (Addendum)
Ongoing with improvement in weight, has lost 21 pounds since last visit and is tolerating Metformin. Continue regimen and have return in 3 months for A1C.  Praised for success at weight loss.  Glucometer script provided.

## 2019-01-02 NOTE — Progress Notes (Signed)
BP 118/79   Pulse 66   Temp 98.6 F (37 C) (Oral)   Wt (!) 379 lb (171.9 kg)   SpO2 94%   BMI 51.40 kg/m    Subjective:    Patient ID: Brian Thornton, male    DOB: 04-Aug-1980, 38 y.o.   MRN: [id number]  HPI: Brian Thornton is a 38 y.o. male  Chief Complaint  Patient presents with  . Diabetes    4 week f/up   DIABETES Recent A1C 6.9% on September and having urinary frequency.  Started on Metformin for initial diabetes treatment.  Had some initial GI issues, but these have resolved.  Has lost 21 pounds since last visit and starting medication.  Is making diet changes. Hypoglycemic episodes:no Polydipsia/polyuria: no Visual disturbance: no Chest pain: no Paresthesias: no Glucose Monitoring: no  Accucheck frequency: Not Checking  Fasting glucose:  Post prandial:  Evening:  Before meals: Taking Insulin?: no  Long acting insulin:  Short acting insulin: Blood Pressure Monitoring: not checking Retinal Examination: Not up to Date Foot Exam: Up to Date Pneumovax: refuses Influenza: refuses Aspirin: no  Relevant past medical, surgical, family and social history reviewed and updated as indicated. Interim medical history since our last visit reviewed. Allergies and medications reviewed and updated.  Review of Systems  Constitutional: Negative for activity change, diaphoresis, fatigue and fever.  Respiratory: Negative for cough, chest tightness, shortness of breath and wheezing.   Cardiovascular: Negative for chest pain, palpitations and leg swelling.  Gastrointestinal: Negative for abdominal distention, abdominal pain, constipation, diarrhea, nausea and vomiting.  Endocrine: Negative for cold intolerance, heat intolerance, polydipsia, polyphagia and polyuria.  Psychiatric/Behavioral: Negative.     Per HPI unless specifically indicated above     Objective:    BP 118/79   Pulse 66   Temp 98.6 F (37 C) (Oral)   Wt (!) 379 lb (171.9 kg)   SpO2 94%   BMI 51.40  kg/m   Wt Readings from Last 3 Encounters:  01/02/19 (!) 379 lb (171.9 kg)  12/23/18 (!) 400 lb (181.4 kg)  12/05/18 (!) 401 lb (181.9 kg)    Physical Exam Vitals signs and nursing note reviewed.  Constitutional:      General: He is awake. He is not in acute distress.    Appearance: He is well-developed. He is morbidly obese. He is not ill-appearing.  HENT:     Head: Normocephalic and atraumatic.     Right Ear: Hearing normal. No drainage.     Left Ear: Hearing normal. No drainage.  Eyes:     General: Lids are normal.        Right eye: No discharge.        Left eye: No discharge.     Conjunctiva/sclera: Conjunctivae normal.     Pupils: Pupils are equal, round, and reactive to light.  Neck:     Musculoskeletal: Normal range of motion and neck supple.     Vascular: No carotid bruit.  Cardiovascular:     Rate and Rhythm: Normal rate and regular rhythm.     Heart sounds: Normal heart sounds, S1 normal and S2 normal. No murmur. No gallop.   Pulmonary:     Effort: Pulmonary effort is normal. No accessory muscle usage or respiratory distress.     Breath sounds: Normal breath sounds.  Abdominal:     General: Bowel sounds are normal.     Palpations: Abdomen is soft.  Musculoskeletal: Normal range of motion.  Right lower leg: No edema.     Left lower leg: No edema.  Skin:    General: Skin is warm and dry.  Neurological:     Mental Status: He is alert and oriented to person, place, and time.  Psychiatric:        Mood and Affect: Mood normal.        Behavior: Behavior normal. Behavior is cooperative.        Thought Content: Thought content normal.        Judgment: Judgment normal.    Diabetic Foot Exam - Simple   Simple Foot Form Visual Inspection No deformities, no ulcerations, no other skin breakdown bilaterally: Yes Sensation Testing Intact to touch and monofilament testing bilaterally: Yes Pulse Check Posterior Tibialis and Dorsalis pulse intact bilaterally: Yes  Comments      Results for orders placed or performed during the hospital encounter of 12/23/18  Urine Culture   Specimen: Urine, Random  Result Value Ref Range   Specimen Description      URINE, RANDOM Performed at [facility], [address], [place], [place] [address]    Special Requests      NONE Performed at [facility], [address], [place], [place] [address]    Culture      NO GROWTH Performed at [facility], [address], [place], [place] [address]    Report Status 12/25/2018 FINAL   Urinalysis, Complete w Microscopic (For [facility] ONLY)  Result Value Ref Range   Color, Urine YELLOW YELLOW   APPearance CLEAR CLEAR   Specific Gravity, Urine 1.025 1.005 - 1.030   pH 6.0 5.0 - 8.0   Glucose, UA NEGATIVE NEGATIVE mg/dL   Hgb urine dipstick NEGATIVE NEGATIVE   Bilirubin Urine SMALL (A) NEGATIVE   Ketones, ur TRACE (A) NEGATIVE mg/dL   Protein, ur 656 (A) NEGATIVE mg/dL   Nitrite NEGATIVE NEGATIVE   Leukocytes,Ua NEGATIVE NEGATIVE   Squamous Epithelial / LPF 6-10 0 - 5   WBC, UA 11-20 0 - 5 WBC/hpf   RBC / HPF NONE SEEN 0 - 5 RBC/hpf   Bacteria, UA FEW (A) NONE SEEN   Hyaline Casts, UA PRESENT    Granular Casts, UA PRESENT       Assessment & Plan:   Problem List Items Addressed This Visit      Endocrine   Type 2 diabetes mellitus without complications (HCC)    Ongoing with improvement in weight, has lost 21 pounds since last visit and is tolerating Metformin. Continue regimen and have return in 3 months for A1C.  Praised for success at weight loss.  Glucometer script provided.          Follow up plan: Return in about 3 months (around 04/04/2019) for T2DM, HTN/HLD.

## 2019-02-03 ENCOUNTER — Ambulatory Visit: Payer: BC Managed Care – PPO | Admitting: Urology

## 2019-02-03 ENCOUNTER — Encounter: Payer: Self-pay | Admitting: Urology

## 2019-02-03 ENCOUNTER — Other Ambulatory Visit: Payer: Self-pay

## 2019-02-03 VITALS — BP 117/69 | HR 72 | Ht 72.0 in | Wt 379.0 lb

## 2019-02-03 DIAGNOSIS — N481 Balanitis: Secondary | ICD-10-CM | POA: Diagnosis not present

## 2019-02-03 NOTE — Progress Notes (Signed)
   02/03/2019 3:37 PM   Brian Thornton 10-17-1980 [id number]  Reason for visit: Follow up phimosis/balanitis   HPI: I saw [name] in clinic for follow-up today.  He is a 38 year old male with morbid obesity and diabetes who presented 6 weeks ago with worsening urinary symptoms of urgency, frequency, penile pain, and pain with erections.  He was drinking quite a bit of soda and diet soda during the day, and he had some balanitis on exam.  We treated him with behavioral strategies for his OAB symptoms, and Mycolog cream for his balanitis.  He reports he is improved significantly and his urgency and frequency is most completely resolved with diet changes.  His penile pain has also significantly improved after 2 weeks of the Mycolog cream.  He has been using it intermittently and not consistently.  On exam his balanitis is improved significantly.  There are some white discoloration of the skin around the glans and the glans itself with some mild irritation.  I recommended using the Mycolog cream twice daily for 2 weeks consistently.  He can follow-up on an as-needed basis.  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  We also reviewed behavioral strategies again regarding OAB and fluid intake.  RTC as needed  A total of 15 minutes were spent face-to-face with the patient, greater than 50% was spent in patient education, counseling, and coordination of care regarding balanitis, buried penis, and OAB.  [name], [facility] [address] [place], [place] [address] [phone]

## 2019-02-03 NOTE — Patient Instructions (Signed)
Balanitis  Balanitis is swelling and irritation (inflammation) of the head of the penis (glans penis). The condition may also cause inflammation of the skin around the glans penis (foreskin) in men who have not been circumcised. It may develop because of an infection or another medical condition. Balanitis occurs most often among men who have not had their foreskin removed (uncircumcised men). Balanitis sometimes causes scarring of the penis or foreskin, which can require surgery. Untreated balanitis can increase the risk of penile cancer. What are the causes? Common causes of this condition include:  Poor personal hygiene, especially in uncircumcised men. Not cleaning the glans penis and foreskin well can result in buildup of bacteria, viruses, and yeast, which can lead to infection and inflammation.  Irritation and lack of air flow due to fluid (smegma) that can build up on the glans penis. Other causes include:  Chemical irritation from products such as soaps or shower gels (especially those that have fragrance), condoms, personal lubricants, petroleum jelly, spermicides, or fabric softeners.  Skin conditions, such as eczema, dermatitis, and psoriasis.  Allergies to medicines, such as tetracycline and sulfa drugs.  Certain medical conditions, including liver cirrhosis, congestive heart failure, diabetes, and kidney disease.  Infections, such as candidiasis, HPV (human papillomavirus), herpes simplex, gonorrhea, and syphilis.  Severe obesity. What increases the risk? The following factors may make you more likely to develop this condition:  Having diabetes. This is the most common risk factor.  Having a tight foreskin that is difficult to pull back (retract) past the glans.  Having sexual intercourse without using a condom. What are the signs or symptoms? Symptoms of this condition include:  Discharge from under the foreskin.  A bad smell.  Pain or difficulty retracting the  foreskin.  Tenderness, redness, and swelling of the glans.  A rash or sores on the glans or foreskin.  Itchiness.  Inability to get an erection due to pain.  Difficulty urinating.  Scarring of the penis or foreskin, in some cases. How is this diagnosed? This condition may be diagnosed based on:  A physical exam.  Testing a swab of discharge to check for bacterial or fungal infection.  Blood tests: ? To check for viruses that can cause balanitis. ? To check your blood sugar (glucose) level. High blood glucose could be a sign of diabetes, which can cause balanitis. How is this treated? Treatment for balanitis depends on the cause. Treatment may include:  Improving personal hygiene. Your health care provider may recommend sitting in a bath of warm water that is deep enough to cover your hips and buttocks (sitz bath).  Medicines such as: ? Creams or ointments to reduce swelling (steroids) or to treat an infection. ? Antibiotic medicine. ? Antifungal medicine.  Surgery to remove or cut the foreskin (circumcision). This may be done if you have scarring on the foreskin that makes it difficult to retract.  Controlling other medical problems that may be causing your condition or making it worse. Follow these instructions at home:  Do not have sex until the condition clears up, or until your health care provider approves.  Keep your penis clean and dry. Take sitz baths as recommended by your health care provider.  Avoid products that irritate your skin or make symptoms worse, such as soaps and shower gels that have fragrance.  Take over-the-counter and prescription medicines only as told by your health care provider. ? If you were prescribed an antibiotic medicine or a cream or ointment, use it as   told by your health care provider. Do not stop using your medicine, cream, or ointment even if you start to feel better. ? Do not drive or use heavy machinery while taking prescription  pain medicine. Contact a health care provider if:  Your symptoms get worse or do not improve with home care.  You develop chills or a fever.  You have trouble urinating.  You cannot retract your foreskin. Get help right away if:  You develop severe pain.  You are unable to urinate. Summary  Balanitis is inflammation of the head of the penis (glans penis) caused by irritation or infection.  Balanitis causes pain, redness, and swelling of the glans penis.  This condition is most common among uncircumcised men who do not keep their glans penis clean and in men who have diabetes.  Treatment may include creams or ointments.  Good hygiene is important for prevention. This includes pulling back the foreskin when washing your penis. This information is not intended to replace advice given to you by your health care provider. Make sure you discuss any questions you have with your health care provider. Document Released: 07/22/2008 Document Revised: 02/15/2017 Document Reviewed: 01/23/2016 Elsevier Patient Education  2020 [employer]  

## 2019-03-01 ENCOUNTER — Encounter: Payer: Self-pay | Admitting: Nurse Practitioner

## 2019-03-05 ENCOUNTER — Telehealth: Payer: Self-pay | Admitting: Nurse Practitioner

## 2019-03-05 NOTE — Telephone Encounter (Signed)
Pt.stated he did not have a way to get his Bp and would rather come into the office,Please advise.

## 2019-03-05 NOTE — Telephone Encounter (Signed)
Can come into office for visit.

## 2019-03-06 ENCOUNTER — Encounter: Payer: Self-pay | Admitting: Nurse Practitioner

## 2019-03-06 ENCOUNTER — Ambulatory Visit: Payer: BC Managed Care – PPO | Admitting: Nurse Practitioner

## 2019-03-06 ENCOUNTER — Other Ambulatory Visit: Payer: Self-pay

## 2019-03-06 VITALS — BP 113/70 | HR 65 | Temp 98.2°F | Wt 348.0 lb

## 2019-03-06 DIAGNOSIS — E790 Hyperuricemia without signs of inflammatory arthritis and tophaceous disease: Secondary | ICD-10-CM

## 2019-03-06 DIAGNOSIS — E1169 Type 2 diabetes mellitus with other specified complication: Secondary | ICD-10-CM

## 2019-03-06 DIAGNOSIS — E119 Type 2 diabetes mellitus without complications: Secondary | ICD-10-CM | POA: Diagnosis not present

## 2019-03-06 DIAGNOSIS — E1159 Type 2 diabetes mellitus with other circulatory complications: Secondary | ICD-10-CM | POA: Diagnosis not present

## 2019-03-06 DIAGNOSIS — I10 Essential (primary) hypertension: Secondary | ICD-10-CM

## 2019-03-06 DIAGNOSIS — E785 Hyperlipidemia, unspecified: Secondary | ICD-10-CM

## 2019-03-06 DIAGNOSIS — M109 Gout, unspecified: Secondary | ICD-10-CM

## 2019-03-06 DIAGNOSIS — I152 Hypertension secondary to endocrine disorders: Secondary | ICD-10-CM

## 2019-03-06 LAB — BAYER DCA HB A1C WAIVED: HB A1C (BAYER DCA - WAIVED): 5.5 % (ref <7.0)

## 2019-03-06 LAB — MICROALBUMIN, URINE WAIVED
Creatinine, Urine Waived: 100 mg/dL (ref 10–300)
Microalb, Ur Waived: 30 mg/L — ABNORMAL HIGH (ref 0–19)
Microalb/Creat Ratio: <30 mg/g (ref <30)

## 2019-03-06 NOTE — Assessment & Plan Note (Signed)
Praised for 31 pound weight loss!!  Recommend continued focus on healthy diet choices and regular physical activity (30 minutes 5 days a week).

## 2019-03-06 NOTE — Assessment & Plan Note (Signed)
Chronic, stable without any current statin.  Is focused on diet and exercise.  Will recheck lipid panel and CMP today.

## 2019-03-06 NOTE — Assessment & Plan Note (Signed)
Chronic, stable with BP below goal.  Continue current medication regimen and adjust as needed. Recommend he check BP at home at least weekly. CMP today.

## 2019-03-06 NOTE — Patient Instructions (Signed)
Carbohydrate Counting for Diabetes Mellitus, Adult  Carbohydrate counting is a method of keeping track of how many carbohydrates you eat. Eating carbohydrates naturally increases the amount of sugar (glucose) in the blood. Counting how many carbohydrates you eat helps keep your blood glucose within normal limits, which helps you manage your diabetes (diabetes mellitus). It is important to know how many carbohydrates you can safely have in each meal. This is different for every person. A diet and nutrition specialist (registered dietitian) can help you make a meal plan and calculate how many carbohydrates you should have at each meal and snack. Carbohydrates are found in the following foods:  Grains, such as breads and cereals.  Dried beans and soy products.  Starchy vegetables, such as potatoes, peas, and corn.  Fruit and fruit juices.  Milk and yogurt.  Sweets and snack foods, such as cake, cookies, candy, chips, and soft drinks. How do I count carbohydrates? There are two ways to count carbohydrates in food. You can use either of the methods or a combination of both. Reading "Nutrition Facts" on packaged food The "Nutrition Facts" list is included on the labels of almost all packaged foods and beverages in the [place] It includes:  The serving size.  Information about nutrients in each serving, including the grams (g) of carbohydrate per serving. To use the "Nutrition Facts":  Decide how many servings you will have.  Multiply the number of servings by the number of carbohydrates per serving.  The resulting number is the total amount of carbohydrates that you will be having. Learning standard serving sizes of other foods When you eat carbohydrate foods that are not packaged or do not include "Nutrition Facts" on the label, you need to measure the servings in order to count the amount of carbohydrates:  Measure the foods that you will eat with a food scale or measuring cup, if needed.   Decide how many standard-size servings you will eat.  Multiply the number of servings by 15. Most carbohydrate-rich foods have about 15 g of carbohydrates per serving. ? For example, if you eat 8 oz (170 g) of strawberries, you will have eaten 2 servings and 30 g of carbohydrates (2 servings x 15 g = 30 g).  For foods that have more than one food mixed, such as soups and casseroles, you must count the carbohydrates in each food that is included. The following list contains standard serving sizes of common carbohydrate-rich foods. Each of these servings has about 15 g of carbohydrates:   hamburger bun or  English muffin.   oz (15 mL) syrup.   oz (14 g) jelly.  1 slice of bread.  1 six-inch tortilla.  3 oz (85 g) cooked rice or pasta.  4 oz (113 g) cooked dried beans.  4 oz (113 g) starchy vegetable, such as peas, corn, or potatoes.  4 oz (113 g) hot cereal.  4 oz (113 g) mashed potatoes or  of a large baked potato.  4 oz (113 g) canned or frozen fruit.  4 oz (120 mL) fruit juice.  4-6 crackers.  6 chicken nuggets.  6 oz (170 g) unsweetened dry cereal.  6 oz (170 g) plain fat-free yogurt or yogurt sweetened with artificial sweeteners.  8 oz (240 mL) milk.  8 oz (170 g) fresh fruit or one small piece of fruit.  24 oz (680 g) popped popcorn. Example of carbohydrate counting Sample meal  3 oz (85 g) chicken breast.  6 oz (170 g)   brown rice.  4 oz (113 g) corn.  8 oz (240 mL) milk.  8 oz (170 g) strawberries with sugar-free whipped topping. Carbohydrate calculation 1. Identify the foods that contain carbohydrates: ? Rice. ? Corn. ? Milk. ? Strawberries. 2. Calculate how many servings you have of each food: ? 2 servings rice. ? 1 serving corn. ? 1 serving milk. ? 1 serving strawberries. 3. Multiply each number of servings by 15 g: ? 2 servings rice x 15 g = 30 g. ? 1 serving corn x 15 g = 15 g. ? 1 serving milk x 15 g = 15 g. ? 1 serving  strawberries x 15 g = 15 g. 4. Add together all of the amounts to find the total grams of carbohydrates eaten: ? 30 g + 15 g + 15 g + 15 g = 75 g of carbohydrates total. Summary  Carbohydrate counting is a method of keeping track of how many carbohydrates you eat.  Eating carbohydrates naturally increases the amount of sugar (glucose) in the blood.  Counting how many carbohydrates you eat helps keep your blood glucose within normal limits, which helps you manage your diabetes.  A diet and nutrition specialist (registered dietitian) can help you make a meal plan and calculate how many carbohydrates you should have at each meal and snack. This information is not intended to replace advice given to you by your health care provider. Make sure you discuss any questions you have with your health care provider. Document Released: 03/05/2005 Document Revised: 09/27/2016 Document Reviewed: 08/17/2015 Elsevier Patient Education  2020 [employer]vier Inc.  

## 2019-03-06 NOTE — Assessment & Plan Note (Signed)
Chronic, stable with A1C today 5.5%.  Praised for 31 pound weight loss.  Have recommended changing to 500 MG daily of Metformin and may consider discontinuation in future if continues to have significant weight loss and good control.  Continue to monitor BS at home.  Return to office in 3 months.

## 2019-03-06 NOTE — Progress Notes (Signed)
BP 113/70   Pulse 65   Temp 98.2 F (36.8 C) (Oral)   Wt (!) 348 lb (157.9 kg)   SpO2 96%   BMI 47.20 kg/m    Subjective:    Patient ID: Brian Thornton, male    DOB: [birth year], 38 y.o.   MRN: [id number]  HPI: Brian Thornton is a 38 y.o. male  Chief Complaint  Patient presents with  . Diabetes    pt states he has not had a recent eye exam   . Gout  . Hypertension   DIABETES Last A1C 6.9% and patient was started on Metformin.  Currently taking 500 MG twice daily.  Has lost 31 pounds since last visit with Meformin and focus on diet/exercise. Hypoglycemic episodes:no Polydipsia/polyuria: no Visual disturbance: no Chest pain: no Paresthesias: no Glucose Monitoring: yes  Accucheck frequency: Daily  Fasting glucose: 90's  Post prandial:  Evening:  Before meals: Taking Insulin?: no  Long acting insulin:  Short acting insulin: Blood Pressure Monitoring: not checking Retinal Examination: Not up to Date Foot Exam: Up to Date Pneumovax: Not up to Date Influenza: Not up to Date Aspirin: yes   HYPERTENSION / HYPERLIPIDEMIA Continues on Lisinopril 40 MG daily.  No current statin. Satisfied with current treatment? yes Duration of hypertension: chronic BP monitoring frequency: not checking BP range:  BP medication side effects: no Duration of hyperlipidemia: chronic Aspirin: no Recent stressors: no Recurrent headaches: no Visual changes: no Palpitations: no Dyspnea: no Chest pain: no Lower extremity edema: no Dizzy/lightheaded: no   GOUT Continues on Allopurinol 300 MG daily.  Last uric acid in 2019 was 5.2. Has not had a flare in over one year. Duration:chronic Swelling: no Redness: no Trauma: no Recent dietary change or indiscretion: no Fevers: no Nausea/vomiting: no Status:  stable Treatments attempted: Allopurinol  Relevant past medical, surgical, family and social history reviewed and updated as indicated. Interim medical history since our last  visit reviewed. Allergies and medications reviewed and updated.  Review of Systems  Constitutional: Negative for activity change, diaphoresis, fatigue and fever.  Respiratory: Negative for cough, chest tightness, shortness of breath and wheezing.   Cardiovascular: Negative for chest pain, palpitations and leg swelling.  Gastrointestinal: Negative for abdominal distention, abdominal pain, constipation, diarrhea, nausea and vomiting.  Endocrine: Negative for cold intolerance, heat intolerance, polydipsia, polyphagia and polyuria.  Neurological: Negative.   Psychiatric/Behavioral: Negative.     Per HPI unless specifically indicated above     Objective:    BP 113/70   Pulse 65   Temp 98.2 F (36.8 C) (Oral)   Wt (!) 348 lb (157.9 kg)   SpO2 96%   BMI 47.20 kg/m   Wt Readings from Last 3 Encounters:  03/06/19 (!) 348 lb (157.9 kg)  02/03/19 (!) 379 lb (171.9 kg)  01/02/19 (!) 379 lb (171.9 kg)    Physical Exam Vitals and nursing note reviewed.  Constitutional:      General: He is awake. He is not in acute distress.    Appearance: He is well-developed. He is morbidly obese. He is not ill-appearing.  HENT:     Head: Normocephalic and atraumatic.     Right Ear: Hearing normal. No drainage.     Left Ear: Hearing normal. No drainage.  Eyes:     General: Lids are normal.        Right eye: No discharge.        Left eye: No discharge.     Conjunctiva/sclera: Conjunctivae normal.  Pupils: Pupils are equal, round, and reactive to light.  Neck:     Vascular: No carotid bruit.  Cardiovascular:     Rate and Rhythm: Normal rate and regular rhythm.     Heart sounds: Normal heart sounds, S1 normal and S2 normal. No murmur. No gallop.   Pulmonary:     Effort: Pulmonary effort is normal. No accessory muscle usage or respiratory distress.     Breath sounds: Normal breath sounds.  Abdominal:     General: Bowel sounds are normal.     Palpations: Abdomen is soft.  Musculoskeletal:         General: Normal range of motion.     Cervical back: Normal range of motion and neck supple.     Right lower leg: No edema.     Left lower leg: No edema.  Skin:    General: Skin is warm and dry.  Neurological:     Mental Status: He is alert and oriented to person, place, and time.  Psychiatric:        Mood and Affect: Mood normal.        Behavior: Behavior normal. Behavior is cooperative.        Thought Content: Thought content normal.        Judgment: Judgment normal.    Diabetic Foot Exam - Simple   Simple Foot Form Visual Inspection No deformities, no ulcerations, no other skin breakdown bilaterally: Yes Sensation Testing Intact to touch and monofilament testing bilaterally: Yes Pulse Check Posterior Tibialis and Dorsalis pulse intact bilaterally: Yes Comments     Results for orders placed or performed in visit on 03/06/19  Bayer DCA Hb A1c Waived  Result Value Ref Range   HB A1C (BAYER DCA - WAIVED) 5.5 <7.0 %  Microalbumin, Urine Waived here  Result Value Ref Range   Microalb, Ur Waived 30 (H) 0 - 19 mg/L   Creatinine, Urine Waived 100 10 - 300 mg/dL   Microalb/Creat Ratio <30 <30 mg/g      Assessment & Plan:   Problem List Items Addressed This Visit      Cardiovascular and Mediastinum   Hypertension associated with diabetes (HCC)    Chronic, stable with BP below goal.  Continue current medication regimen and adjust as needed. Recommend he check BP at home at least weekly. CMP today.      Relevant Orders   Bayer DCA Hb A1c Waived (Completed)   Comprehensive metabolic panel     Endocrine   Hyperlipidemia associated with type 2 diabetes mellitus (HCC)    Chronic, stable without any current statin.  Is focused on diet and exercise.  Will recheck lipid panel and CMP today.       Relevant Orders   Bayer DCA Hb A1c Waived (Completed)   Comprehensive metabolic panel   Lipid Panel w/o Chol/HDL Ratio out   Type 2 diabetes mellitus without complications  (HCC) - Primary    Chronic, stable with A1C today 5.5%.  Praised for 31 pound weight loss.  Have recommended changing to 500 MG daily of Metformin and may consider discontinuation in future if continues to have significant weight loss and good control.  Continue to monitor BS at home.  Return to office in 3 months.      Relevant Orders   Bayer DCA Hb A1c Waived (Completed)   Microalbumin, Urine Waived here (Completed)     Other   Gout    Chronic, ongoing.  Continue current medication regimen and adjust  as needed. Check uric acid level and if remains low could consider reduction in Allopurinol to 100 MG daily, weight loss and diet changes will help significantly with flares.      Relevant Orders   Uric acid   Morbid obesity (HCC)    Praised for 31 pound weight loss!!  Recommend continued focus on healthy diet choices and regular physical activity (30 minutes 5 days a week).       RESOLVED: Hyperuricemia       Follow up plan: Return in about 3 months (around 06/04/2019) for T2DM, HTN/HLD.

## 2019-03-06 NOTE — Assessment & Plan Note (Signed)
Chronic, ongoing.  Continue current medication regimen and adjust as needed. Check uric acid level and if remains low could consider reduction in Allopurinol to 100 MG daily, weight loss and diet changes will help significantly with flares.

## 2019-03-07 LAB — COMPREHENSIVE METABOLIC PANEL
ALT: 23 IU/L (ref 0–44)
AST: 18 IU/L (ref 0–40)
Albumin/Globulin Ratio: 2.3 — ABNORMAL HIGH (ref 1.2–2.2)
Albumin: 4.6 g/dL (ref 4.0–5.0)
Alkaline Phosphatase: 83 IU/L (ref 39–117)
BUN/Creatinine Ratio: 12 (ref 9–20)
BUN: 13 mg/dL (ref 6–20)
Bilirubin Total: 0.4 mg/dL (ref 0.0–1.2)
CO2: 25 mmol/L (ref 20–29)
Calcium: 9.7 mg/dL (ref 8.7–10.2)
Chloride: 105 mmol/L (ref 96–106)
Creatinine, Ser: 1.09 mg/dL (ref 0.76–1.27)
GFR calc Af Amer: 100 mL/min/{1.73_m2} (ref >59)
GFR calc non Af Amer: 86 mL/min/{1.73_m2} (ref >59)
Globulin, Total: 2 g/dL (ref 1.5–4.5)
Glucose: 85 mg/dL (ref 65–99)
Potassium: 4.2 mmol/L (ref 3.5–5.2)
Sodium: 144 mmol/L (ref 134–144)
Total Protein: 6.6 g/dL (ref 6.0–8.5)

## 2019-03-07 LAB — LIPID PANEL W/O CHOL/HDL RATIO
Cholesterol, Total: 146 mg/dL (ref 100–199)
HDL: 35 mg/dL — ABNORMAL LOW (ref >39)
LDL Chol Calc (NIH): 88 mg/dL (ref 0–99)
Triglycerides: 125 mg/dL (ref 0–149)
VLDL Cholesterol Cal: 23 mg/dL (ref 5–40)

## 2019-03-07 LAB — URIC ACID: Uric Acid: 5 mg/dL (ref 3.8–8.4)

## 2019-03-19 ENCOUNTER — Other Ambulatory Visit: Payer: Self-pay | Admitting: Unknown Physician Specialty

## 2019-04-10 ENCOUNTER — Ambulatory Visit: Payer: BC Managed Care – PPO | Admitting: Nurse Practitioner

## 2019-06-05 ENCOUNTER — Other Ambulatory Visit: Payer: Self-pay

## 2019-06-05 ENCOUNTER — Ambulatory Visit: Payer: Self-pay | Admitting: Nurse Practitioner

## 2019-06-05 ENCOUNTER — Ambulatory Visit (INDEPENDENT_AMBULATORY_CARE_PROVIDER_SITE_OTHER): Payer: BC Managed Care – PPO | Admitting: Unknown Physician Specialty

## 2019-06-05 ENCOUNTER — Encounter: Payer: Self-pay | Admitting: Unknown Physician Specialty

## 2019-06-05 VITALS — BP 115/77 | HR 60 | Temp 98.2°F | Wt 320.0 lb

## 2019-06-05 DIAGNOSIS — E1159 Type 2 diabetes mellitus with other circulatory complications: Secondary | ICD-10-CM

## 2019-06-05 DIAGNOSIS — M109 Gout, unspecified: Secondary | ICD-10-CM | POA: Diagnosis not present

## 2019-06-05 DIAGNOSIS — E1169 Type 2 diabetes mellitus with other specified complication: Secondary | ICD-10-CM | POA: Diagnosis not present

## 2019-06-05 DIAGNOSIS — I152 Hypertension secondary to endocrine disorders: Secondary | ICD-10-CM

## 2019-06-05 DIAGNOSIS — E785 Hyperlipidemia, unspecified: Secondary | ICD-10-CM | POA: Diagnosis not present

## 2019-06-05 DIAGNOSIS — E119 Type 2 diabetes mellitus without complications: Secondary | ICD-10-CM | POA: Diagnosis not present

## 2019-06-05 DIAGNOSIS — I10 Essential (primary) hypertension: Secondary | ICD-10-CM

## 2019-06-05 LAB — BAYER DCA HB A1C WAIVED: HB A1C (BAYER DCA - WAIVED): 5.3 % (ref <7.0)

## 2019-06-05 LAB — MICROALBUMIN, URINE WAIVED
Creatinine, Urine Waived: 100 mg/dL (ref 10–300)
Microalb, Ur Waived: 30 mg/L — ABNORMAL HIGH (ref 0–19)
Microalb/Creat Ratio: <30 mg/g (ref <30)

## 2019-06-05 MED ORDER — LISINOPRIL 20 MG PO TABS: 20.0000 mg | ORAL_TABLET | Freq: Every day | ORAL | 3 refills | Status: DC

## 2019-06-05 NOTE — Assessment & Plan Note (Signed)
Stable.  Microalbumin 30.  Will continue Lisinopril but cut from 40 mg-20 mg.

## 2019-06-05 NOTE — Assessment & Plan Note (Signed)
He cut back on Allopurinol from 600 to 300 mg.  Will check Uric Acid level today and start tapering.

## 2019-06-05 NOTE — Assessment & Plan Note (Signed)
Hgb A1C is 5.3%  Will stop Metformin

## 2019-06-05 NOTE — Progress Notes (Signed)
BP 115/77   Pulse 60   Temp 98.2 F (36.8 C) (Oral)   Wt (!) 320 lb (145.2 kg)   SpO2 98%   BMI 43.40 kg/m    Subjective:    Patient ID: Brian Thornton, male    DOB: 1980-12-11, 39 y.o.   MRN: [id number]  HPI: Brian Thornton is a 39 y.o. male  Chief Complaint  Patient presents with  . Diabetes  . Hyperlipidemia  . Hypertension   Diabetes: Taking Metformin   No hypoglycemic episodes No hyperglycemic episodes Feet problems: Blood Sugars averaging: eye exam within last year Last Hgb A1C: 5.5%  Hypertension  Using medications without difficulty Average home BPs not checking  Using medication without problems or lightheadedness No chest pain with exertion or shortness of breath No Edema  Elevated Cholesterol Using medications without problems No Muscle aches  Diet: Exercise: Cut out sweets.  Thinking about exercising more  Relevant past medical, surgical, family and social history reviewed and updated as indicated. Interim medical history since our last visit reviewed. Allergies and medications reviewed and updated.  Review of Systems  Per HPI unless specifically indicated above     Objective:    BP 115/77   Pulse 60   Temp 98.2 F (36.8 C) (Oral)   Wt (!) 320 lb (145.2 kg)   SpO2 98%   BMI 43.40 kg/m   Wt Readings from Last 3 Encounters:  06/05/19 (!) 320 lb (145.2 kg)  03/06/19 (!) 348 lb (157.9 kg)  02/03/19 (!) 379 lb (171.9 kg)    Physical Exam Constitutional:      General: He is not in acute distress.    Appearance: Normal appearance. He is well-developed.  HENT:     Head: Normocephalic and atraumatic.  Eyes:     General: Lids are normal. No scleral icterus.       Right eye: No discharge.        Left eye: No discharge.     Conjunctiva/sclera: Conjunctivae normal.  Neck:     Vascular: No carotid bruit or JVD.  Cardiovascular:     Rate and Rhythm: Normal rate and regular rhythm.     Heart sounds: Normal heart sounds.  Pulmonary:       Effort: Pulmonary effort is normal. No respiratory distress.     Breath sounds: Normal breath sounds.  Abdominal:     Palpations: There is no hepatomegaly or splenomegaly.  Musculoskeletal:        General: Normal range of motion.     Cervical back: Normal range of motion and neck supple.  Skin:    General: Skin is warm and dry.     Coloration: Skin is not pale.     Findings: No rash.  Neurological:     Mental Status: He is alert and oriented to person, place, and time.  Psychiatric:        Behavior: Behavior normal.        Thought Content: Thought content normal.        Judgment: Judgment normal.     Results for orders placed or performed in visit on 03/06/19  Bayer DCA Hb A1c Waived  Result Value Ref Range   HB A1C (BAYER DCA - WAIVED) 5.5 <7.0 %  Comprehensive metabolic panel  Result Value Ref Range   Glucose 85 65 - 99 mg/dL   BUN 13 6 - 20 mg/dL   Creatinine, Ser 1.09 0.76 - 1.27 mg/dL   GFR calc non Af  Amer 86 >59 mL/min/1.73   GFR calc Af Amer 100 >59 mL/min/1.73   BUN/Creatinine Ratio 12 9 - 20   Sodium 144 134 - 144 mmol/L   Potassium 4.2 3.5 - 5.2 mmol/L   Chloride 105 96 - 106 mmol/L   CO2 25 20 - 29 mmol/L   Calcium 9.7 8.7 - 10.2 mg/dL   Total Protein 6.6 6.0 - 8.5 g/dL   Albumin 4.6 4.0 - 5.0 g/dL   Globulin, Total 2.0 1.5 - 4.5 g/dL   Albumin/Globulin Ratio 2.3 (H) 1.2 - 2.2   Bilirubin Total 0.4 0.0 - 1.2 mg/dL   Alkaline Phosphatase 83 39 - 117 IU/L   AST 18 0 - 40 IU/L   ALT 23 0 - 44 IU/L  Lipid Panel w/o Chol/HDL Ratio out  Result Value Ref Range   Cholesterol, Total 146 100 - 199 mg/dL   Triglycerides 948 0 - 149 mg/dL   HDL 35 (L) >54 mg/dL   VLDL Cholesterol Cal 23 5 - 40 mg/dL   LDL Chol Calc (NIH) 88 0 - 99 mg/dL  Uric acid  Result Value Ref Range   Uric Acid 5.0 3.8 - 8.4 mg/dL  Microalbumin, Urine Waived here  Result Value Ref Range   Microalb, Ur Waived 30 (H) 0 - 19 mg/L   Creatinine, Urine Waived 100 10 - 300 mg/dL    Microalb/Creat Ratio <30 <30 mg/g      Assessment & Plan:   Problem List Items Addressed This Visit      Unprioritized   Gout    He cut back on Allopurinol from 600 to 300 mg.  Will check Uric Acid level today and start tapering.        Relevant Orders   Comprehensive metabolic panel   Uric acid   Hyperlipidemia associated with type 2 diabetes mellitus (HCC)   Relevant Medications   lisinopril (ZESTRIL) 20 MG tablet   Other Relevant Orders   Lipid Panel w/o Chol/HDL Ratio   Hypertension associated with diabetes (HCC)    Stable.  Microalbumin 30.  Will continue Lisinopril but cut from 40 mg-20 mg.        Relevant Medications   lisinopril (ZESTRIL) 20 MG tablet   Type 2 diabetes mellitus without complications (HCC) - Primary    Hgb A1C is 5.3%  Will stop Metformin      Relevant Medications   lisinopril (ZESTRIL) 20 MG tablet   Other Relevant Orders   Lipid Panel w/o Chol/HDL Ratio   Comprehensive metabolic panel   Bayer DCA Hb O2V Waived   Microalbumin, Urine Waived       Follow up plan: Return in about 3 months (around 09/05/2019). In order to assure changes are successful.

## 2019-06-06 LAB — COMPREHENSIVE METABOLIC PANEL
ALT: 27 IU/L (ref 0–44)
AST: 25 IU/L (ref 0–40)
Albumin/Globulin Ratio: 2.5 — ABNORMAL HIGH (ref 1.2–2.2)
Albumin: 4.7 g/dL (ref 4.0–5.0)
Alkaline Phosphatase: 76 IU/L (ref 39–117)
BUN/Creatinine Ratio: 14 (ref 9–20)
BUN: 15 mg/dL (ref 6–20)
Bilirubin Total: 0.5 mg/dL (ref 0.0–1.2)
CO2: 23 mmol/L (ref 20–29)
Calcium: 9.5 mg/dL (ref 8.7–10.2)
Chloride: 103 mmol/L (ref 96–106)
Creatinine, Ser: 1.05 mg/dL (ref 0.76–1.27)
GFR calc Af Amer: 104 mL/min/{1.73_m2} (ref >59)
GFR calc non Af Amer: 90 mL/min/{1.73_m2} (ref >59)
Globulin, Total: 1.9 g/dL (ref 1.5–4.5)
Glucose: 88 mg/dL (ref 65–99)
Potassium: 4.5 mmol/L (ref 3.5–5.2)
Sodium: 144 mmol/L (ref 134–144)
Total Protein: 6.6 g/dL (ref 6.0–8.5)

## 2019-06-06 LAB — LIPID PANEL W/O CHOL/HDL RATIO
Cholesterol, Total: 143 mg/dL (ref 100–199)
HDL: 39 mg/dL — ABNORMAL LOW (ref >39)
LDL Chol Calc (NIH): 87 mg/dL (ref 0–99)
Triglycerides: 86 mg/dL (ref 0–149)
VLDL Cholesterol Cal: 17 mg/dL (ref 5–40)

## 2019-06-06 LAB — URIC ACID: Uric Acid: 6.5 mg/dL (ref 3.8–8.4)

## 2019-09-11 ENCOUNTER — Other Ambulatory Visit: Payer: Self-pay

## 2019-09-11 ENCOUNTER — Encounter: Payer: Self-pay | Admitting: Nurse Practitioner

## 2019-09-11 ENCOUNTER — Ambulatory Visit (INDEPENDENT_AMBULATORY_CARE_PROVIDER_SITE_OTHER): Payer: BC Managed Care – PPO | Admitting: Nurse Practitioner

## 2019-09-11 VITALS — BP 117/72 | HR 65 | Temp 98.0°F | Wt 313.0 lb

## 2019-09-11 DIAGNOSIS — E1169 Type 2 diabetes mellitus with other specified complication: Secondary | ICD-10-CM | POA: Diagnosis not present

## 2019-09-11 DIAGNOSIS — Z6841 Body Mass Index (BMI) 40.0 and over, adult: Secondary | ICD-10-CM

## 2019-09-11 DIAGNOSIS — E785 Hyperlipidemia, unspecified: Secondary | ICD-10-CM

## 2019-09-11 DIAGNOSIS — I152 Hypertension secondary to endocrine disorders: Secondary | ICD-10-CM

## 2019-09-11 DIAGNOSIS — E1159 Type 2 diabetes mellitus with other circulatory complications: Secondary | ICD-10-CM | POA: Diagnosis not present

## 2019-09-11 DIAGNOSIS — I10 Essential (primary) hypertension: Secondary | ICD-10-CM

## 2019-09-11 DIAGNOSIS — E669 Obesity, unspecified: Secondary | ICD-10-CM

## 2019-09-11 LAB — BAYER DCA HB A1C WAIVED: HB A1C (BAYER DCA - WAIVED): 5.2 % (ref <7.0)

## 2019-09-11 MED ORDER — LISINOPRIL 10 MG PO TABS: 10.0000 mg | ORAL_TABLET | Freq: Every day | ORAL | 3 refills | Status: DC

## 2019-09-11 NOTE — Assessment & Plan Note (Signed)
Chronic, stable with BP well below goal.  Will reduce Lisinopril to 10 MG, maintain it for kidney protection with proteinuria.  Check BMP next visit.  Recommend continued focus on diet and weight loss at home.  Return in 3 months.

## 2019-09-11 NOTE — Assessment & Plan Note (Signed)
Chronic, stable without any current statin.  Is focused on diet and exercise.  Will recheck lipid panel next visit.

## 2019-09-11 NOTE — Assessment & Plan Note (Signed)
Chronic, stable with A1C today 5.2% without medication.  Praised for significant weight loss.  Continue to monitor BS at home occasionally and focus on diet and weight loss.  Return to office in 3 months.

## 2019-09-11 NOTE — Assessment & Plan Note (Signed)
Has lost significant weight over past months.  Recommended eating smaller high protein, low fat meals more frequently and exercising 30 mins a day 5 times a week with a goal of 10-15lb weight loss in the next 3 months. Patient voiced their understanding and motivation to adhere to these recommendations.

## 2019-09-11 NOTE — Assessment & Plan Note (Signed)
Praised for significant weight loss.  Continue focus on diet and exercise. 

## 2019-09-11 NOTE — Progress Notes (Signed)
BP 117/72   Pulse 65   Temp 98 F (36.7 C) (Oral)   Wt (!) 313 lb (142 kg)   SpO2 98%   BMI 42.45 kg/m    Subjective:    Patient ID: Brian Thornton, male    DOB: January 03, 1981, 39 y.o.   MRN: [id number]  HPI: Brian Thornton is a 39 y.o. male  Chief Complaint  Patient presents with  . Diabetes    no recent eye exam   . Hypertension   DIABETES Last A1C 5.3% and Metformin stopped.  Has lost significant weight, 35 pounds since December -- no sugars and watching portions. Hypoglycemic episodes:no Polydipsia/polyuria: no Visual disturbance: no Chest pain: no Paresthesias: no Glucose Monitoring: yes  Accucheck frequency: not regularly  Fasting glucose: 70-80  Post prandial:  Evening:  Before meals: Taking Insulin?: no  Long acting insulin:  Short acting insulin: Blood Pressure Monitoring: not checking Retinal Examination: Not up to Date Foot Exam: Up to Date Pneumovax: Not up to Date Influenza: Up To Date Aspirin: yes   HYPERTENSION / HYPERLIPIDEMIA Continues on Lisinopril 20 MG daily.  No current statin. Satisfied with current treatment? yes Duration of hypertension: chronic BP monitoring frequency: not checking BP range:  BP medication side effects: no Duration of hyperlipidemia: chronic Aspirin: no Recent stressors: no Recurrent headaches: no Visual changes: no Palpitations: no Dyspnea: no Chest pain: no Lower extremity edema: no Dizzy/lightheaded: no   Relevant past medical, surgical, family and social history reviewed and updated as indicated. Interim medical history since our last visit reviewed. Allergies and medications reviewed and updated.  Review of Systems  Constitutional: Negative for activity change, diaphoresis, fatigue and fever.  Respiratory: Negative for cough, chest tightness, shortness of breath and wheezing.   Cardiovascular: Negative for chest pain, palpitations and leg swelling.  Gastrointestinal: Negative for abdominal  distention, abdominal pain, constipation, diarrhea, nausea and vomiting.  Endocrine: Negative for cold intolerance, heat intolerance, polydipsia, polyphagia and polyuria.  Neurological: Negative.   Psychiatric/Behavioral: Negative.     Per HPI unless specifically indicated above     Objective:    BP 117/72   Pulse 65   Temp 98 F (36.7 C) (Oral)   Wt (!) 313 lb (142 kg)   SpO2 98%   BMI 42.45 kg/m   Wt Readings from Last 3 Encounters:  09/11/19 (!) 313 lb (142 kg)  06/05/19 (!) 320 lb (145.2 kg)  03/06/19 (!) 348 lb (157.9 kg)    Physical Exam Vitals and nursing note reviewed.  Constitutional:      General: He is awake. He is not in acute distress.    Appearance: He is well-developed. He is morbidly obese. He is not ill-appearing.  HENT:     Head: Normocephalic and atraumatic.     Right Ear: Hearing normal. No drainage.     Left Ear: Hearing normal. No drainage.  Eyes:     General: Lids are normal.        Right eye: No discharge.        Left eye: No discharge.     Conjunctiva/sclera: Conjunctivae normal.     Pupils: Pupils are equal, round, and reactive to light.  Neck:     Vascular: No carotid bruit.  Cardiovascular:     Rate and Rhythm: Normal rate and regular rhythm.     Heart sounds: Normal heart sounds, S1 normal and S2 normal. No murmur heard.  No gallop.   Pulmonary:     Effort: Pulmonary  effort is normal. No accessory muscle usage or respiratory distress.     Breath sounds: Normal breath sounds.  Abdominal:     General: Bowel sounds are normal.     Palpations: Abdomen is soft.  Musculoskeletal:        General: Normal range of motion.     Cervical back: Normal range of motion and neck supple.     Right lower leg: No edema.     Left lower leg: No edema.  Skin:    General: Skin is warm and dry.  Neurological:     Mental Status: He is alert and oriented to person, place, and time.  Psychiatric:        Mood and Affect: Mood normal.        Behavior:  Behavior normal. Behavior is cooperative.        Thought Content: Thought content normal.        Judgment: Judgment normal.    Results for orders placed or performed in visit on 06/05/19  Lipid Panel w/o Chol/HDL Ratio  Result Value Ref Range   Cholesterol, Total 143 100 - 199 mg/dL   Triglycerides 86 0 - 149 mg/dL   HDL 39 (L) >39 mg/dL   VLDL Cholesterol Cal 17 5 - 40 mg/dL   LDL Chol Calc (NIH) 87 0 - 99 mg/dL  Comprehensive metabolic panel  Result Value Ref Range   Glucose 88 65 - 99 mg/dL   BUN 15 6 - 20 mg/dL   Creatinine, Ser 1.05 0.76 - 1.27 mg/dL   GFR calc non Af Amer 90 >59 mL/min/1.73   GFR calc Af Amer 104 >59 mL/min/1.73   BUN/Creatinine Ratio 14 9 - 20   Sodium 144 134 - 144 mmol/L   Potassium 4.5 3.5 - 5.2 mmol/L   Chloride 103 96 - 106 mmol/L   CO2 23 20 - 29 mmol/L   Calcium 9.5 8.7 - 10.2 mg/dL   Total Protein 6.6 6.0 - 8.5 g/dL   Albumin 4.7 4.0 - 5.0 g/dL   Globulin, Total 1.9 1.5 - 4.5 g/dL   Albumin/Globulin Ratio 2.5 (H) 1.2 - 2.2   Bilirubin Total 0.5 0.0 - 1.2 mg/dL   Alkaline Phosphatase 76 39 - 117 IU/L   AST 25 0 - 40 IU/L   ALT 27 0 - 44 IU/L  Bayer DCA Hb A1c Waived  Result Value Ref Range   HB A1C (BAYER DCA - WAIVED) 5.3 <7.0 %  Microalbumin, Urine Waived  Result Value Ref Range   Microalb, Ur Waived 30 (H) 0 - 19 mg/L   Creatinine, Urine Waived 100 10 - 300 mg/dL   Microalb/Creat Ratio <30 <30 mg/g  Uric acid  Result Value Ref Range   Uric Acid 6.5 3.8 - 8.4 mg/dL      Assessment & Plan:   Problem List Items Addressed This Visit      Cardiovascular and Mediastinum   Hypertension associated with diabetes ([place])    Chronic, stable with BP well below goal.  Will reduce Lisinopril to 10 MG, maintain it for kidney protection with proteinuria.  Check BMP next visit.  Recommend continued focus on diet and weight loss at home.  Return in 3 months.      Relevant Medications   lisinopril (ZESTRIL) 10 MG tablet     Endocrine    Hyperlipidemia associated with type 2 diabetes mellitus (HCC)    Chronic, stable without any current statin.  Is focused on diet and exercise.  Will recheck lipid panel next visit.      Relevant Medications   lisinopril (ZESTRIL) 10 MG tablet   Type 2 diabetes mellitus with obesity (HCC) - Primary    Chronic, stable with A1C today 5.2% without medication.  Praised for significant weight loss.  Continue to monitor BS at home occasionally and focus on diet and weight loss.  Return to office in 3 months.      Relevant Medications   lisinopril (ZESTRIL) 10 MG tablet   Other Relevant Orders   Bayer DCA Hb A1c Waived     Other   Morbid obesity (HCC)    Has lost significant weight over past months.  Recommended eating smaller high protein, low fat meals more frequently and exercising 30 mins a day 5 times a week with a goal of 10-15lb weight loss in the next 3 months. Patient voiced their understanding and motivation to adhere to these recommendations.       BMI 40.0-44.9, adult [facility])    Praised for significant weight loss.  Continue focus on diet and exercise.          Follow up plan: Return in about 3 months (around 12/12/2019) for T2DM, HTN/HLD.

## 2019-09-11 NOTE — Patient Instructions (Signed)
DASH Eating Plan DASH stands for "Dietary Approaches to Stop Hypertension." The DASH eating plan is a healthy eating plan that has been shown to reduce high blood pressure (hypertension). It may also reduce your risk for type 2 diabetes, heart disease, and stroke. The DASH eating plan may also help with weight loss. What are tips for following this plan?  General guidelines  Avoid eating more than 2,300 mg (milligrams) of salt (sodium) a day. If you have hypertension, you may need to reduce your sodium intake to 1,500 mg a day.  Limit alcohol intake to no more than 1 drink a day for nonpregnant women and 2 drinks a day for men. One drink equals 12 oz of beer, 5 oz of wine, or 1 oz of hard liquor.  Work with your health care provider to maintain a healthy body weight or to lose weight. Ask what an ideal weight is for you.  Get at least 30 minutes of exercise that causes your heart to beat faster (aerobic exercise) most days of the week. Activities may include walking, swimming, or biking.  Work with your health care provider or diet and nutrition specialist (dietitian) to adjust your eating plan to your individual calorie needs. Reading food labels   Check food labels for the amount of sodium per serving. Choose foods with less than 5 percent of the Daily Value of sodium. Generally, foods with less than 300 mg of sodium per serving fit into this eating plan.  To find whole grains, look for the word "whole" as the first word in the ingredient list. Shopping  Buy products labeled as "low-sodium" or "no salt added."  Buy fresh foods. Avoid canned foods and premade or frozen meals. Cooking  Avoid adding salt when cooking. Use salt-free seasonings or herbs instead of table salt or sea salt. Check with your health care provider or pharmacist before using salt substitutes.  Do not fry foods. Cook foods using healthy methods such as baking, boiling, grilling, and broiling instead.  Cook with  heart-healthy oils, such as olive, canola, soybean, or sunflower oil. Meal planning  Eat a balanced diet that includes: ? 5 or more servings of fruits and vegetables each day. At each meal, try to fill half of your plate with fruits and vegetables. ? Up to 6-8 servings of whole grains each day. ? Less than 6 oz of lean meat, poultry, or fish each day. A 3-oz serving of meat is about the same size as a deck of cards. One egg equals 1 oz. ? 2 servings of low-fat dairy each day. ? A serving of nuts, seeds, or beans 5 times each week. ? Heart-healthy fats. Healthy fats called Omega-3 fatty acids are found in foods such as flaxseeds and coldwater fish, like sardines, salmon, and mackerel.  Limit how much you eat of the following: ? Canned or prepackaged foods. ? Food that is high in trans fat, such as fried foods. ? Food that is high in saturated fat, such as fatty meat. ? Sweets, desserts, sugary drinks, and other foods with added sugar. ? Full-fat dairy products.  Do not salt foods before eating.  Try to eat at least 2 vegetarian meals each week.  Eat more home-cooked food and less restaurant, buffet, and fast food.  When eating at a restaurant, ask that your food be prepared with less salt or no salt, if possible. What foods are recommended? The items listed may not be a complete list. Talk with your dietitian about   what dietary choices are best for you. Grains Whole-grain or whole-wheat bread. Whole-grain or whole-wheat pasta. Brown rice. Oatmeal. Quinoa. Bulgur. Whole-grain and low-sodium cereals. Pita bread. Low-fat, low-sodium crackers. Whole-wheat flour tortillas. Vegetables Fresh or frozen vegetables (raw, steamed, roasted, or grilled). Low-sodium or reduced-sodium tomato and vegetable juice. Low-sodium or reduced-sodium tomato sauce and tomato paste. Low-sodium or reduced-sodium canned vegetables. Fruits All fresh, dried, or frozen fruit. Canned fruit in natural juice (without  added sugar). Meat and other protein foods Skinless chicken or turkey. Ground chicken or turkey. Pork with fat trimmed off. Fish and seafood. Egg whites. Dried beans, peas, or lentils. Unsalted nuts, nut butters, and seeds. Unsalted canned beans. Lean cuts of beef with fat trimmed off. Low-sodium, lean deli meat. Dairy Low-fat (1%) or fat-free (skim) milk. Fat-free, low-fat, or reduced-fat cheeses. Nonfat, low-sodium ricotta or cottage cheese. Low-fat or nonfat yogurt. Low-fat, low-sodium cheese. Fats and oils Soft margarine without trans fats. Vegetable oil. Low-fat, reduced-fat, or light mayonnaise and salad dressings (reduced-sodium). Canola, safflower, olive, soybean, and sunflower oils. Avocado. Seasoning and other foods Herbs. Spices. Seasoning mixes without salt. Unsalted popcorn and pretzels. Fat-free sweets. What foods are not recommended? The items listed may not be a complete list. Talk with your dietitian about what dietary choices are best for you. Grains Baked goods made with fat, such as croissants, muffins, or some breads. Dry pasta or rice meal packs. Vegetables Creamed or fried vegetables. Vegetables in a cheese sauce. Regular canned vegetables (not low-sodium or reduced-sodium). Regular canned tomato sauce and paste (not low-sodium or reduced-sodium). Regular tomato and vegetable juice (not low-sodium or reduced-sodium). Pickles. Olives. Fruits Canned fruit in a light or heavy syrup. Fried fruit. Fruit in cream or butter sauce. Meat and other protein foods Fatty cuts of meat. Ribs. Fried meat. Bacon. Sausage. Bologna and other processed lunch meats. Salami. Fatback. Hotdogs. Bratwurst. Salted nuts and seeds. Canned beans with added salt. Canned or smoked fish. Whole eggs or egg yolks. Chicken or turkey with skin. Dairy Whole or 2% milk, cream, and half-and-half. Whole or full-fat cream cheese. Whole-fat or sweetened yogurt. Full-fat cheese. Nondairy creamers. Whipped toppings.  Processed cheese and cheese spreads. Fats and oils Butter. Stick margarine. Lard. Shortening. Ghee. Bacon fat. Tropical oils, such as coconut, palm kernel, or palm oil. Seasoning and other foods Salted popcorn and pretzels. Onion salt, garlic salt, seasoned salt, table salt, and sea salt. Worcestershire sauce. Tartar sauce. Barbecue sauce. Teriyaki sauce. Soy sauce, including reduced-sodium. Steak sauce. Canned and packaged gravies. Fish sauce. Oyster sauce. Cocktail sauce. Horseradish that you find on the shelf. Ketchup. Mustard. Meat flavorings and tenderizers. Bouillon cubes. Hot sauce and Tabasco sauce. Premade or packaged marinades. Premade or packaged taco seasonings. Relishes. Regular salad dressings. Where to find more information:  National Heart, Lung, and Blood Institute: www.nhlbi.nih.gov  American Heart Association: www.heart.org Summary  The DASH eating plan is a healthy eating plan that has been shown to reduce high blood pressure (hypertension). It may also reduce your risk for type 2 diabetes, heart disease, and stroke.  With the DASH eating plan, you should limit salt (sodium) intake to 2,300 mg a day. If you have hypertension, you may need to reduce your sodium intake to 1,500 mg a day.  When on the DASH eating plan, aim to eat more fresh fruits and vegetables, whole grains, lean proteins, low-fat dairy, and heart-healthy fats.  Work with your health care provider or diet and nutrition specialist (dietitian) to adjust your eating plan to your   individual calorie needs. This information is not intended to replace advice given to you by your health care provider. Make sure you discuss any questions you have with your health care provider. Document Revised: 02/15/2017 Document Reviewed: 02/27/2016 Elsevier Patient Education  2020 [employer]  

## 2019-12-18 ENCOUNTER — Ambulatory Visit: Payer: BC Managed Care – PPO | Admitting: Nurse Practitioner

## 2020-01-01 ENCOUNTER — Ambulatory Visit (INDEPENDENT_AMBULATORY_CARE_PROVIDER_SITE_OTHER): Payer: BC Managed Care – PPO | Admitting: Nurse Practitioner

## 2020-01-01 ENCOUNTER — Encounter: Payer: Self-pay | Admitting: Nurse Practitioner

## 2020-01-01 ENCOUNTER — Other Ambulatory Visit: Payer: Self-pay

## 2020-01-01 VITALS — BP 115/77 | HR 78 | Temp 98.1°F | Resp 16 | Ht 72.0 in | Wt 322.8 lb

## 2020-01-01 DIAGNOSIS — I152 Hypertension secondary to endocrine disorders: Secondary | ICD-10-CM

## 2020-01-01 DIAGNOSIS — E1159 Type 2 diabetes mellitus with other circulatory complications: Secondary | ICD-10-CM | POA: Diagnosis not present

## 2020-01-01 DIAGNOSIS — Z6841 Body Mass Index (BMI) 40.0 and over, adult: Secondary | ICD-10-CM | POA: Diagnosis not present

## 2020-01-01 DIAGNOSIS — E1169 Type 2 diabetes mellitus with other specified complication: Secondary | ICD-10-CM

## 2020-01-01 DIAGNOSIS — E669 Obesity, unspecified: Secondary | ICD-10-CM

## 2020-01-01 DIAGNOSIS — E785 Hyperlipidemia, unspecified: Secondary | ICD-10-CM | POA: Diagnosis not present

## 2020-01-01 DIAGNOSIS — R21 Rash and other nonspecific skin eruption: Secondary | ICD-10-CM

## 2020-01-01 LAB — BAYER DCA HB A1C WAIVED: HB A1C (BAYER DCA - WAIVED): 5.2 % (ref <7.0)

## 2020-01-01 MED ORDER — TRIAMCINOLONE ACETONIDE 0.1 % EX CREA: 1.0000 "application " | TOPICAL_CREAM | Freq: Two times a day (BID) | CUTANEOUS | 0 refills | Status: DC

## 2020-01-01 NOTE — Patient Instructions (Signed)
Rash, Adult  A rash is a change in the color of your skin. A rash can also change the way your skin feels. There are many different conditions and factors that can cause a rash. Follow these instructions at home: The goal of treatment is to stop the itching and keep the rash from spreading. Watch for any changes in your symptoms. Let your doctor know about them. Follow these instructions to help with your condition: Medicine Take or apply over-the-counter and prescription medicines only as told by your doctor. These may include medicines:  To treat red or swollen skin (corticosteroid creams).  To treat itching.  To treat an allergy (oral antihistamines).  To treat very bad symptoms (oral corticosteroids).  Skin care  Put cool cloths (compresses) on the affected areas.  Do not scratch or rub your skin.  Avoid covering the rash. Make sure that the rash is exposed to air as much as possible. Managing itching and discomfort  Avoid hot showers or baths. These can make itching worse. A cold shower may help.  Try taking a bath with: ? Epsom salts. You can get these at your local pharmacy or grocery store. Follow the instructions on the package. ? Baking soda. Pour a small amount into the bath as told by your doctor. ? Colloidal oatmeal. You can get this at your local pharmacy or grocery store. Follow the instructions on the package.  Try putting baking soda paste onto your skin. Stir water into baking soda until it gets like a paste.  Try putting on a lotion that relieves itchiness (calamine lotion).  Keep cool and out of the sun. Sweating and being hot can make itching worse. General instructions   Rest as needed.  Drink enough fluid to keep your pee (urine) pale yellow.  Wear loose-fitting clothing.  Avoid scented soaps, detergents, and perfumes. Use gentle soaps, detergents, perfumes, and other cosmetic products.  Avoid anything that causes your rash. Keep a journal to  help track what causes your rash. Write down: ? What you eat. ? What cosmetic products you use. ? What you drink. ? What you wear. This includes jewelry.  Keep all follow-up visits as told by your doctor. This is important. Contact a doctor if:  You sweat at night.  You lose weight.  You pee (urinate) more than normal.  You pee less than normal, or you notice that your pee is a darker color than normal.  You feel weak.  You throw up (vomit).  Your skin or the whites of your eyes look yellow (jaundice).  Your skin: ? Tingles. ? Is numb.  Your rash: ? Does not go away after a few days. ? Gets worse.  You are: ? More thirsty than normal. ? More tired than normal.  You have: ? New symptoms. ? Pain in your belly (abdomen). ? A fever. ? Watery poop (diarrhea). Get help right away if:  You have a fever and your symptoms suddenly get worse.  You start to feel mixed up (confused).  You have a very bad headache or a stiff neck.  You have very bad joint pains or stiffness.  You have jerky movements that you cannot control (seizure).  Your rash covers all or most of your body. The rash may or may not be painful.  You have blisters that: ? Are on top of the rash. ? Grow larger. ? Grow together. ? Are painful. ? Are inside your nose or mouth.  You have a rash   that: ? Looks like purple pinprick-sized spots all over your body. ? Has a "bull's eye" or looks like a target. ? Is red and painful, causes your skin to peel, and is not from being in the sun too long. Summary  A rash is a change in the color of your skin. A rash can also change the way your skin feels.  The goal of treatment is to stop the itching and keep the rash from spreading.  Take or apply over-the-counter and prescription medicines only as told by your doctor.  Contact a doctor if you have new symptoms or symptoms that get worse.  Keep all follow-up visits as told by your doctor. This is  important. This information is not intended to replace advice given to you by your health care provider. Make sure you discuss any questions you have with your health care provider. Document Revised: 06/27/2018 Document Reviewed: 10/07/2017 Elsevier Patient Education  2020 [employer]  

## 2020-01-01 NOTE — Assessment & Plan Note (Signed)
Praised for significant weight loss.  Continue focus on diet and exercise.

## 2020-01-01 NOTE — Progress Notes (Signed)
BP 115/77 (BP Location: Left Arm, Patient Position: Sitting, Cuff Size: Large)   Pulse 78   Temp 98.1 F (36.7 C) (Oral)   Resp 16   Ht 6' (1.829 m)   Wt (!) 322 lb 12.8 oz (146.4 kg)   SpO2 96%   BMI 43.78 kg/m    Subjective:    Patient ID: Brian Thornton, male    DOB: 01/07/1981, 39 y.o.   MRN: [id number]  HPI: Brian Thornton is a 39 y.o. male  Chief Complaint  Patient presents with  . Diabetes   DIABETES Last A1C 5.2% in June.  Metformin stopped in March due to A1C 5.3%, improved and having weight loss.  Has lost significant weight, 35 pounds since December -- no sugars and watching portions -- gained about 9 pounds since last visit. Hypoglycemic episodes:no Polydipsia/polyuria: no Visual disturbance: no Chest pain: no Paresthesias: no Glucose Monitoring: yes  Accucheck frequency: not checking  Fasting glucose:   Post prandial:  Evening:  Before meals: Taking Insulin?: no  Long acting insulin:  Short acting insulin: Blood Pressure Monitoring: not checking Retinal Examination: Not up to Date Foot Exam: Up to Date Pneumovax: Not up to Date Influenza: Up To Date Aspirin: yes   HYPERTENSION / HYPERLIPIDEMIA Continues on Lisinopril 20 MG daily.  No current statin. Satisfied with current treatment? yes Duration of hypertension: chronic BP monitoring frequency: not checking BP range:  BP medication side effects: no Duration of hyperlipidemia: chronic Aspirin: no Recent stressors: no Recurrent headaches: no Visual changes: no Palpitations: no Dyspnea: no Chest pain: no Lower extremity edema: no Dizzy/lightheaded: no   RASH Present to back of left leg, was previously on arms.  Has been present for weeks.  Has not been around any poison ivy or oak. Duration:  weeks  Location: arms and legs  Itching: yes Burning: no Redness: no Oozing: no Scaling: no Blisters: no Painful: no Fevers: no Change in detergents/soaps/personal care products:  no Recent illness: no Recent travel:no History of same: yes Context: fluctuating Alleviating factors: nothing Treatments attempted:poison ivy cream Shortness of breath: no  Throat/tongue swelling: no Myalgias/arthralgias: no  Relevant past medical, surgical, family and social history reviewed and updated as indicated. Interim medical history since our last visit reviewed. Allergies and medications reviewed and updated.  Review of Systems  Constitutional: Negative for activity change, diaphoresis, fatigue and fever.  Respiratory: Negative for cough, chest tightness, shortness of breath and wheezing.   Cardiovascular: Negative for chest pain, palpitations and leg swelling.  Gastrointestinal: Negative.   Endocrine: Negative for cold intolerance, heat intolerance, polydipsia, polyphagia and polyuria.  Skin: Positive for rash.  Neurological: Negative.   Psychiatric/Behavioral: Negative.     Per HPI unless specifically indicated above     Objective:    BP 115/77 (BP Location: Left Arm, Patient Position: Sitting, Cuff Size: Large)   Pulse 78   Temp 98.1 F (36.7 C) (Oral)   Resp 16   Ht 6' (1.829 m)   Wt (!) 322 lb 12.8 oz (146.4 kg)   SpO2 96%   BMI 43.78 kg/m   Wt Readings from Last 3 Encounters:  01/01/20 (!) 322 lb 12.8 oz (146.4 kg)  09/11/19 (!) 313 lb (142 kg)  06/05/19 (!) 320 lb (145.2 kg)    Physical Exam Vitals and nursing note reviewed.  Constitutional:      General: He is awake. He is not in acute distress.    Appearance: He is well-developed. He is morbidly obese.  He is not ill-appearing.  HENT:     Head: Normocephalic and atraumatic.     Right Ear: Hearing normal. No drainage.     Left Ear: Hearing normal. No drainage.  Eyes:     General: Lids are normal.        Right eye: No discharge.        Left eye: No discharge.     Conjunctiva/sclera: Conjunctivae normal.     Pupils: Pupils are equal, round, and reactive to light.  Neck:     Vascular: No  carotid bruit.  Cardiovascular:     Rate and Rhythm: Normal rate and regular rhythm.     Heart sounds: Normal heart sounds, S1 normal and S2 normal. No murmur heard.  No gallop.   Pulmonary:     Effort: Pulmonary effort is normal. No accessory muscle usage or respiratory distress.     Breath sounds: Normal breath sounds.  Abdominal:     General: Bowel sounds are normal.     Palpations: Abdomen is soft.  Musculoskeletal:        General: Normal range of motion.     Cervical back: Normal range of motion and neck supple.     Right lower leg: No edema.     Left lower leg: No edema.  Skin:    General: Skin is warm and dry.     Findings: Rash present. Rash is macular.     Comments: Linear pattern, raised macular rash to posterior left calf with no vesicles or scaling.  Skin intact.  No tenderness or warmth to area.  Neurological:     Mental Status: He is alert and oriented to person, place, and time.  Psychiatric:        Mood and Affect: Mood normal.        Behavior: Behavior normal. Behavior is cooperative.        Thought Content: Thought content normal.        Judgment: Judgment normal.    Results for orders placed or performed in visit on 09/11/19  Bayer DCA Hb A1c Waived  Result Value Ref Range   HB A1C (BAYER DCA - WAIVED) 5.2 <7.0 %      Assessment & Plan:   Problem List Items Addressed This Visit      Cardiovascular and Mediastinum   Hypertension associated with diabetes (HCC)    Chronic, stable with BP well below goal.  Will continue Lisinopril at 10 MG, maintain it for kidney protection with proteinuria.  Check BMP today.  Recommend continued focus on diet and weight loss at home.  Return in 6 months.      Relevant Orders   Basic metabolic panel     Endocrine   Hyperlipidemia associated with type 2 diabetes mellitus (HCC)    Chronic, stable without any current statin.  Is focused on diet and exercise.  Will recheck lipid panel today and consider statin if  elevations.      Relevant Orders   Lipid Panel w/o Chol/HDL Ratio   Type 2 diabetes mellitus with obesity (HCC) - Primary    Chronic, stable with A1C today 5.2% without medication.  Praised for significant weight loss and recommended he continue focus as has gained a little back.  Continue to monitor BS at home occasionally and focus on diet and weight loss.  Return to office in 6 months.      Relevant Orders   Bayer DCA Hb A1c Waived     Other  Morbid obesity (HCC)    With T2DM and HTN.  Recommended eating smaller high protein, low fat meals more frequently and exercising 30 mins a day 5 times a week with a goal of 10-15lb weight loss in the next 3 months. Patient voiced their understanding and motivation to adhere to these recommendations.       BMI 40.0-44.9, adult [facility])    Praised for significant weight loss.  Continue focus on diet and exercise.       Other Visit Diagnoses    Rash       Suspect irritant dermatitis, will send in steroid cream.  Recommend return to office for ongoing or worsening.       Follow up plan: Return in about 6 months (around 07/01/2020) for T2DM, HTN/HLD.

## 2020-01-01 NOTE — Assessment & Plan Note (Signed)
Chronic, stable with BP well below goal.  Will continue Lisinopril at 10 MG, maintain it for kidney protection with proteinuria.  Check BMP today.  Recommend continued focus on diet and weight loss at home.  Return in 6 months.

## 2020-01-01 NOTE — Assessment & Plan Note (Signed)
With T2DM and HTN.  Recommended eating smaller high protein, low fat meals more frequently and exercising 30 mins a day 5 times a week with a goal of 10-15lb weight loss in the next 3 months. Patient voiced their understanding and motivation to adhere to these recommendations. ° °

## 2020-01-01 NOTE — Assessment & Plan Note (Signed)
Chronic, stable without any current statin.  Is focused on diet and exercise.  Will recheck lipid panel today and consider statin if elevations. 

## 2020-01-01 NOTE — Assessment & Plan Note (Signed)
Chronic, stable with A1C today 5.2% without medication.  Praised for significant weight loss and recommended he continue focus as has gained a little back.  Continue to monitor BS at home occasionally and focus on diet and weight loss.  Return to office in 6 months.

## 2020-01-02 LAB — LIPID PANEL W/O CHOL/HDL RATIO
Cholesterol, Total: 163 mg/dL (ref 100–199)
HDL: 36 mg/dL — ABNORMAL LOW (ref >39)
LDL Chol Calc (NIH): 86 mg/dL (ref 0–99)
Triglycerides: 243 mg/dL — ABNORMAL HIGH (ref 0–149)
VLDL Cholesterol Cal: 41 mg/dL — ABNORMAL HIGH (ref 5–40)

## 2020-01-02 LAB — BASIC METABOLIC PANEL
BUN/Creatinine Ratio: 14 (ref 9–20)
BUN: 16 mg/dL (ref 6–20)
CO2: 20 mmol/L (ref 20–29)
Calcium: 9.4 mg/dL (ref 8.7–10.2)
Chloride: 104 mmol/L (ref 96–106)
Creatinine, Ser: 1.16 mg/dL (ref 0.76–1.27)
GFR calc Af Amer: 92 mL/min/{1.73_m2} (ref >59)
GFR calc non Af Amer: 79 mL/min/{1.73_m2} (ref >59)
Glucose: 88 mg/dL (ref 65–99)
Potassium: 4.1 mmol/L (ref 3.5–5.2)
Sodium: 144 mmol/L (ref 134–144)

## 2020-01-03 NOTE — Progress Notes (Signed)
Contacted via MyChart Good morning Brian Thornton, your labs have returned.  Your electrolytes and kidney function continue to be stable.  Cholesterol levels triglycerides continue to be slightly elevated.  You can take some fish oil 1g daily and recheck next visit. Also should reduce saturated fats and sugars in diet.  Any questions? Keep being awesome!!  Thank you for allowing me to participate in your care. Kindest regards, Rhetta Cleek

## 2020-03-14 ENCOUNTER — Telehealth: Payer: Self-pay

## 2020-03-14 ENCOUNTER — Other Ambulatory Visit: Payer: Self-pay | Admitting: Nurse Practitioner

## 2020-03-14 DIAGNOSIS — U071 COVID-19: Secondary | ICD-10-CM

## 2020-03-14 NOTE — Telephone Encounter (Signed)
Copied from CRM [id number]. Topic: General - Inquiry >> Mar 14, 2020 11:24 AM Brian Thornton D wrote: Reason for CRM: Pt called saying he tested positive yesterday for covid with an in home covid test.  He wants to know what he needs to do now.  He said he is having fever chills, nausea a little cough.  CB#  [phone]  FYI pt scheduled for 03/15/2020 VIRUTAL WITH Dr.Rumball

## 2020-03-14 NOTE — Telephone Encounter (Signed)
Called and discussed with patient. States he went and had another rapid test done that did come back positive as well. Patient verbalized understanding of Jolene's instructions.

## 2020-03-14 NOTE — Telephone Encounter (Signed)
Routing to provider  

## 2020-03-14 NOTE — Telephone Encounter (Signed)
I would recommend to self quarantine x 10 days, ensure no contact with others during this time to prevent spread.  Recommend over the counter medications like Diabetic Tussin for cough and also supplements like Vitamin D3, Vitamin C, Zinc, and Quercetin daily.  Maintain appointment with provider tomorrow for further assessment.  I have contacted the monoclonal antibody infusion team who should reach out to him in about 24 to 48 hours about the outpatient infusion available in [facility] for those at higher risk with Covid, I have alerted them he is not vaccinated and having symptoms.  I would recommend a test here at office too, which I can order today.  Also ensure you have photos or copy of home test for the infusion team.  Thank you.

## 2020-03-15 ENCOUNTER — Encounter: Payer: Self-pay | Admitting: Family Medicine

## 2020-03-15 ENCOUNTER — Telehealth (INDEPENDENT_AMBULATORY_CARE_PROVIDER_SITE_OTHER): Payer: BC Managed Care – PPO | Admitting: Family Medicine

## 2020-03-15 ENCOUNTER — Telehealth: Payer: Self-pay | Admitting: Unknown Physician Specialty

## 2020-03-15 VITALS — Temp 100.4°F

## 2020-03-15 DIAGNOSIS — U071 COVID-19: Secondary | ICD-10-CM | POA: Diagnosis not present

## 2020-03-15 NOTE — Progress Notes (Signed)
Virtual Visit via Video Note  I connected with ADITH TEJADA on 03/15/20 at  1:20 PM EST by a video enabled telemedicine application and verified that I am speaking with the correct person using two identifiers.  Location: Patient: home Provider: [facility]   I discussed the limitations of evaluation and management by telemedicine and the availability of in person appointments. The patient expressed understanding and agreed to proceed.  History of Present Illness:  UPPER RESPIRATORY TRACT INFECTION - COVID positive 12/26, not vaccinated - referred for MAB tx  - symptom onset 03/06/20. Worst symptom: fever, chills, nausea, cough, body aches, diarrhea Fever: yes Cough: yes Shortness of breath: no Wheezing: no Chest pain: no Chest tightness: no Chest congestion: yes Nasal congestion: yes Runny nose: no Sneezing: no Headache: yes Ear pain: no  Vomiting: no Sick contacts: yes Context: stable Recurrent sinusitis: no Relief with OTC cold/cough medications: yes  Treatments attempted: tylenol, tylenol-flu    Observations/Objective:  Well appearing, in NAD. Speaks in full sentences, in no resp distress.   Assessment and Plan:  COVID With positive at home test. Discussed OTC symptom relief. Referred for MAB tx. Emergency precautions and self-quarantine guidelines discussed.   I discussed the assessment and treatment plan with the patient. The patient was provided an opportunity to ask questions and all were answered. The patient agreed with the plan and demonstrated an understanding of the instructions.   The patient was advised to call back or seek an in-person evaluation if the symptoms worsen or if the condition fails to improve as anticipated.  I provided 7 minutes of non-face-to-face time during this encounter.   Caro Laroche, DO

## 2020-03-15 NOTE — Telephone Encounter (Signed)
Called to discuss with [name] about Covid symptoms and the use of  monoclonal antibody infusion for those with mild to moderate Covid symptoms and at a high risk of hospitalization.     Pt does not qualify for infusion therapy as his symptoms first presented > 7 days prior to timing of infusion. Symptoms tier reviewed as well as criteria for ending isolation. Preventative practices reviewed. Patient verbalized understanding   Patient Active Problem List   Diagnosis Date Noted  . COVID 03/15/2020  . BMI 40.0-44.9, adult (HCC) 11/03/2018  . Eczema 07/09/2016  . Family history of colon cancer 02/21/2015  . Hypertension associated with diabetes (HCC) 09/24/2014  . Gout 09/24/2014  . Hyperlipidemia associated with type 2 diabetes mellitus (HCC) 09/24/2014  . Type 2 diabetes mellitus with obesity (HCC) 09/24/2014  . Morbid obesity (HCC) 09/24/2014

## 2020-03-15 NOTE — Assessment & Plan Note (Signed)
With positive at home test. Discussed OTC symptom relief. Referred for MAB tx. Emergency precautions and self-quarantine guidelines discussed.

## 2020-03-15 NOTE — Patient Instructions (Signed)
It was great to see you!  Our plans for today:  - You may end your self-quarantine when you are 10 days from symptom onset and fever free for at least 24 hours without having to use medications to bring your fever down.  - someone from the monoclonal antibody team should be contacting you about an appointment. - Continue to use over the counter cough/cold medication to help with your symptoms (mucinex, delsum, dayquil). - I recommend becoming vaccinated against COVID once you heal from your current infection. If you end up getting the antibody infusion, you will have to wait at least 3 months before you can get this. - Certainly if you have difficulties breathing or are unable to hold down fluids, go to the Emergency Department.   Take care and seek immediate care sooner if you develop any concerns.   Dr. Linwood Dibbles

## 2020-03-29 ENCOUNTER — Other Ambulatory Visit: Payer: Self-pay

## 2020-03-29 ENCOUNTER — Ambulatory Visit
Admission: EM | Admit: 2020-03-29 | Discharge: 2020-03-29 | Disposition: A | Discharge status: Home / Self Care | Payer: BC Managed Care – PPO | Attending: Emergency Medicine | Admitting: Emergency Medicine

## 2020-03-29 ENCOUNTER — Telehealth: Payer: Self-pay

## 2020-03-29 DIAGNOSIS — N3000 Acute cystitis without hematuria: Secondary | ICD-10-CM

## 2020-03-29 LAB — URINALYSIS, COMPLETE (UACMP) WITH MICROSCOPIC
Bilirubin Urine: NEGATIVE
Glucose, UA: NEGATIVE mg/dL
Ketones, ur: NEGATIVE mg/dL
Nitrite: POSITIVE — AB
Protein, ur: NEGATIVE mg/dL
Specific Gravity, Urine: 1.02 (ref 1.005–1.030)
Squamous Epithelial / HPF: NONE SEEN (ref 0–5)
pH: 7 (ref 5.0–8.0)

## 2020-03-29 MED ORDER — SULFAMETHOXAZOLE-TRIMETHOPRIM 800-160 MG PO TABS: 1.0000 | ORAL_TABLET | Freq: Two times a day (BID) | ORAL | 0 refills | Status: AC

## 2020-03-29 MED ORDER — PHENAZOPYRIDINE HCL 200 MG PO TABS: 200.0000 mg | ORAL_TABLET | Freq: Three times a day (TID) | ORAL | 0 refills | Status: DC

## 2020-03-29 NOTE — ED Provider Notes (Signed)
[facility]    CSN: [id number] Arrival date & time: 03/29/20  1856      History   Chief Complaint Chief Complaint  Patient presents with  . Urinary Tract Infection    HPI SABASTION HRDLICKA is a 40 y.o. male.   HPI   40 year old male here for evaluation of 2 days worth of painful urination, urinary urgency, frequency, and odor.  Patient also reports that he has had some low back pain which has resolved and patient is also had intermittent left testicle pain which is also resolved.  Denies fever, penile discharge, urinating at night, hematuria, cloudiness to his urine, changes to the strength or consistency of his stream, abdominal pain, nausea, vomiting, or diarrhea.  Past Medical History:  Diagnosis Date  . Dysmetabolic syndrome   . Extreme obesity   . Gout   . Hyperlipidemia   . Hypertension   . IFG (impaired fasting glucose)     Patient Active Problem List   Diagnosis Date Noted  . COVID 03/15/2020  . BMI 40.0-44.9, adult (HCC) 11/03/2018  . Eczema 07/09/2016  . Family history of colon cancer 02/21/2015  . Hypertension associated with diabetes (HCC) 09/24/2014  . Gout 09/24/2014  . Hyperlipidemia associated with type 2 diabetes mellitus (HCC) 09/24/2014  . Type 2 diabetes mellitus with obesity (HCC) 09/24/2014  . Morbid obesity (HCC) 09/24/2014    Past Surgical History:  Procedure Laterality Date  . ORIF ANKLE FRACTURE Left 11/03/2018   Procedure: LEFT ANKLE STABILIZATION;  Surgeon: Christena Flake, MD;  Location: [facility] ORS;  Service: Orthopedics;  Laterality: Left;       Home Medications    Prior to Admission medications   Medication Sig Start Date End Date Taking? Authorizing Provider  phenazopyridine (PYRIDIUM) 200 MG tablet Take 1 tablet (200 mg total) by mouth 3 (three) times daily. 03/29/20  Yes Becky Augusta, NP  sulfamethoxazole-trimethoprim (BACTRIM DS) 800-160 MG tablet Take 1 tablet by mouth 2 (two) times daily for 7 days. 03/29/20 04/05/20  Yes Becky Augusta, NP  indomethacin (INDOCIN) 50 MG capsule Take 1 capsule (50 mg total) by mouth 3 (three) times daily as needed(for gout flare only). 03/10/18   Cannady, Corrie Dandy T, NP  lisinopril (ZESTRIL) 10 MG tablet Take 1 tablet (10 mg total) by mouth daily. 09/11/19   Cannady, Corrie Dandy T, NP  triamcinolone cream (KENALOG) 0.1 % Apply 1 application topically 2 (two) times daily. 01/01/20   Marjie Skiff, NP    Family History Family History  Problem Relation Age of Onset  . Diabetes Father   . Gout Brother   . Heart attack Maternal Grandmother   . Cancer Maternal Grandfather        colon  . Cancer Paternal Grandfather        prostate    Social History Social History   Tobacco Use  . Smoking status: Never Smoker  . Smokeless tobacco: Never Used  Vaping Use  . Vaping Use: Never used  Substance Use Topics  . Alcohol use: No    Alcohol/week: 0.0 standard drinks  . Drug use: No     Allergies   Febuxostat   Review of Systems Review of Systems  Constitutional: Positive for fatigue. Negative for activity change, appetite change and fever.  Gastrointestinal: Negative for abdominal pain, diarrhea, nausea and vomiting.  Genitourinary: Positive for dysuria, frequency, testicular pain and urgency. Negative for decreased urine volume, hematuria, penile discharge, penile pain, penile swelling and scrotal swelling.  Musculoskeletal: Positive  for back pain.  Skin: Negative for rash.  Hematological: Negative.   Psychiatric/Behavioral: Negative.      Physical Exam Triage Vital Signs ED Triage Vitals  Enc Vitals Group     BP 03/29/20 1105 114/73     Pulse Rate 03/29/20 1105 (!) 104     Resp 03/29/20 1105 18     Temp 03/29/20 1105 98.7 F (37.1 C)     Temp Source 03/29/20 1105 Oral     SpO2 03/29/20 1105 95 %     Weight 03/29/20 1102 (!) 310 lb (140.6 kg)     Height --      Head Circumference --      Peak Flow --      Pain Score 03/29/20 1101 0     Pain Loc --       Pain Edu? --      Excl. in GC? --    No data found.  Updated Vital Signs BP 114/73 (BP Location: Left Arm)   Pulse (!) 104   Temp 98.7 F (37.1 C) (Oral)   Resp 18   Wt (!) 310 lb (140.6 kg)   SpO2 95%   BMI 42.04 kg/m   Visual Acuity Right Eye Distance:   Left Eye Distance:   Bilateral Distance:    Right Eye Near:   Left Eye Near:    Bilateral Near:     Physical Exam Vitals and nursing note reviewed.  Constitutional:      General: He is not in acute distress.    Appearance: Normal appearance. He is obese. He is not toxic-appearing.  HENT:     Head: Normocephalic and atraumatic.  Cardiovascular:     Rate and Rhythm: Normal rate and regular rhythm.     Pulses: Normal pulses.     Heart sounds: Normal heart sounds. No murmur heard. No gallop.   Pulmonary:     Effort: Pulmonary effort is normal.     Breath sounds: No wheezing, rhonchi or rales.  Abdominal:     Tenderness: There is no right CVA tenderness or left CVA tenderness.  Genitourinary:    Penis: Normal.      Testes: Normal.  Musculoskeletal:        General: Normal range of motion.  Skin:    General: Skin is warm and dry.     Capillary Refill: Capillary refill takes less than 2 seconds.     Findings: No erythema or rash.  Neurological:     General: No focal deficit present.     Mental Status: He is alert and oriented to person, place, and time.  Psychiatric:        Mood and Affect: Mood normal.        Behavior: Behavior normal.        Thought Content: Thought content normal.        Judgment: Judgment normal.      UC Treatments / Results  Labs (all labs ordered are listed, but only abnormal results are displayed) Labs Reviewed  URINALYSIS, COMPLETE (UACMP) WITH MICROSCOPIC - Abnormal; Notable for the following components:      Result Value   APPearance HAZY (*)    Hgb urine dipstick TRACE (*)    Nitrite POSITIVE (*)    Leukocytes,Ua MODERATE (*)    Bacteria, UA MANY (*)    All other  components within normal limits  URINE CULTURE    EKG   Radiology No results found.  Procedures Procedures (including critical care  time)  Medications Ordered in UC Medications - No data to display  Initial Impression / Assessment and Plan / UC Course  I have reviewed the triage vital signs and the nursing notes.  Pertinent labs & imaging results that were available during my care of the patient were reviewed by me and considered in my medical decision making (see chart for details).   Patient is here for evaluation of UTI symptoms and been going on for last 2 days.  Patient is nontoxic appearing patient does have a history of diabetes but he is on diet control at present.  Patient reports his last A1c was 4.9.  Patient denies any penile discharge.  Genital exam is unremarkable.  Will check UA.  UA shows trace hemoglobin, nitrite positive, moderate leukocytes, 21-50 WBCs and many bacteria.  Will send urine for culture.  Will treat patient with Bactrim twice daily for 7 days.   Final Clinical Impressions(s) / UC Diagnoses   Final diagnoses:  Acute cystitis without hematuria     Discharge Instructions     Take the Bactrim twice daily with full glass of water for 7 days for treatment of urinary tract infection.  Increase your oral fluid intake to help increase your urine production and flush your urinary system.  Use the Pyridium every 8 hours as needed for urinary discomfort.  It will turn your urine bright red shortage.  We will send your urine for culture and change the antibiotics if we need to based on the results.  If you develop fever, nausea and vomiting, or other worsening symptoms return for reevaluation or go to the ER.    ED Prescriptions    Medication Sig Dispense Auth. Provider   sulfamethoxazole-trimethoprim (BACTRIM DS) 800-160 MG tablet Take 1 tablet by mouth 2 (two) times daily for 7 days. 14 tablet Becky Augusta, NP   phenazopyridine (PYRIDIUM) 200  MG tablet Take 1 tablet (200 mg total) by mouth 3 (three) times daily. 6 tablet Becky Augusta, NP     PDMP not reviewed this encounter.   Becky Augusta, NP 03/29/20 1221

## 2020-03-29 NOTE — Telephone Encounter (Signed)
Called pt to relay of Brian Thornton's message, pt states that he did go to urgent care and he has a uti. Pt was prescribed medicine and culture was sent, he will contact us if need be.

## 2020-03-29 NOTE — ED Triage Notes (Signed)
Patient presents to Urgent Care with complaints of burning sensation, increased urine frequency, odor to urine, and left testicle discomfort x 2 days. States did have lower back pain x 2 days ago has resolved. Pt concerned with possible UTI.   Denies fever and abdominal pain.

## 2020-03-29 NOTE — Telephone Encounter (Signed)
Called pt to see about scheduling, no answer, left vm  Copied from CRM [id number]. Topic: Quick Communication - See Telephone Encounter >> Mar 29, 2020  8:09 AM Aretta Nip wrote: CRM for notification. See Telephone encounter for: 03/29/20.pls touch base about sch this pt, prostate issues, pt have covid around Christmas, not sure of date exact, back at work, just small cough and energy level issues rather than that fine. Pt needs to be worked in if possible extreme urgency urination, [place]? FU for appt [phone]

## 2020-03-29 NOTE — Discharge Instructions (Addendum)
Take the Bactrim twice daily with full glass of water for 7 days for treatment of urinary tract infection.  Increase your oral fluid intake to help increase your urine production and flush your urinary system.  Use the Pyridium every 8 hours as needed for urinary discomfort.  It will turn your urine bright red shortage.  We will send your urine for culture and change the antibiotics if we need to based on the results.  If you develop fever, nausea and vomiting, or other worsening symptoms return for reevaluation or go to the ER.

## 2020-03-29 NOTE — Telephone Encounter (Signed)
Noted, would like to follow-up with him next week in office please.

## 2020-03-29 NOTE — Telephone Encounter (Signed)
Pt called back states that he just left work and is going to to the restroom every 15 mins. We do not have any available in person appts today pt states that he is going to urgent care and will go from there.

## 2020-03-29 NOTE — Telephone Encounter (Signed)
Noted  

## 2020-04-01 LAB — URINE CULTURE
Culture: >=100000 — AB
Special Requests: NORMAL

## 2020-07-01 ENCOUNTER — Encounter: Payer: Self-pay | Admitting: Nurse Practitioner

## 2020-07-01 ENCOUNTER — Ambulatory Visit: Payer: BC Managed Care – PPO | Admitting: Nurse Practitioner

## 2020-07-01 DIAGNOSIS — Z8616 Personal history of COVID-19: Secondary | ICD-10-CM | POA: Insufficient documentation

## 2020-07-07 ENCOUNTER — Ambulatory Visit: Payer: Self-pay | Admitting: Nurse Practitioner

## 2020-07-14 ENCOUNTER — Ambulatory Visit (INDEPENDENT_AMBULATORY_CARE_PROVIDER_SITE_OTHER): Payer: 59 | Admitting: Nurse Practitioner

## 2020-07-14 ENCOUNTER — Other Ambulatory Visit: Payer: Self-pay

## 2020-07-14 ENCOUNTER — Encounter: Payer: Self-pay | Admitting: Nurse Practitioner

## 2020-07-14 VITALS — BP 122/84 | HR 53 | Temp 97.7°F | Wt 349.4 lb

## 2020-07-14 DIAGNOSIS — Z23 Encounter for immunization: Secondary | ICD-10-CM

## 2020-07-14 DIAGNOSIS — I152 Hypertension secondary to endocrine disorders: Secondary | ICD-10-CM

## 2020-07-14 DIAGNOSIS — Z1159 Encounter for screening for other viral diseases: Secondary | ICD-10-CM

## 2020-07-14 DIAGNOSIS — Z6841 Body Mass Index (BMI) 40.0 and over, adult: Secondary | ICD-10-CM

## 2020-07-14 DIAGNOSIS — E785 Hyperlipidemia, unspecified: Secondary | ICD-10-CM

## 2020-07-14 DIAGNOSIS — E1159 Type 2 diabetes mellitus with other circulatory complications: Secondary | ICD-10-CM

## 2020-07-14 DIAGNOSIS — E669 Obesity, unspecified: Secondary | ICD-10-CM

## 2020-07-14 DIAGNOSIS — E1169 Type 2 diabetes mellitus with other specified complication: Secondary | ICD-10-CM

## 2020-07-14 LAB — BAYER DCA HB A1C WAIVED: HB A1C (BAYER DCA - WAIVED): 5.3 % (ref <7.0)

## 2020-07-14 MED ORDER — LISINOPRIL 10 MG PO TABS: 10.0000 mg | ORAL_TABLET | Freq: Every day | ORAL | 4 refills | Status: DC

## 2020-07-14 NOTE — Progress Notes (Signed)
BP 122/84   Pulse (!) 53   Temp 97.7 F (36.5 C) (Oral)   Wt (!) 349 lb 6.4 oz (158.5 kg)   SpO2 99%   BMI 47.39 kg/m    Subjective:    Patient ID: Brian Thornton, male    DOB: Jul 24, 1980, 40 y.o.   MRN: [id number]  HPI: Brian Thornton is a 40 y.o. male  Chief Complaint  Patient presents with  . Diabetes  . Hyperlipidemia  . Hypertension   DIABETES Last A1C 5.2% in October.  Metformin stopped in March 2021 due to A1C 5.3%, improved and having weight loss.  Has gained weight, 39 pounds since December. Hypoglycemic episodes:no Polydipsia/polyuria: no Visual disturbance: no Chest pain: no Paresthesias: no Glucose Monitoring: yes  Accucheck frequency: not checking  Fasting glucose:   Post prandial:  Evening:  Before meals: Taking Insulin?: no  Long acting insulin:  Short acting insulin: Blood Pressure Monitoring: not checking Retinal Examination: Not up to Date Foot Exam: Up to Date Pneumovax: Not up to Date Influenza: Up To Date Aspirin: yes   HYPERTENSION / HYPERLIPIDEMIA Continues on Lisinopril 20 MG daily.  No current statin. Satisfied with current treatment? yes Duration of hypertension: chronic BP monitoring frequency: not checking BP range:  BP medication side effects: no Duration of hyperlipidemia: chronic Aspirin: no Recent stressors: no Recurrent headaches: no Visual changes: no Palpitations: no Dyspnea: no Chest pain: no Lower extremity edema: no Dizzy/lightheaded: no   Relevant past medical, surgical, family and social history reviewed and updated as indicated. Interim medical history since our last visit reviewed. Allergies and medications reviewed and updated.  Review of Systems  Constitutional: Negative for activity change, diaphoresis, fatigue and fever.  Respiratory: Negative for cough, chest tightness, shortness of breath and wheezing.   Cardiovascular: Negative for chest pain, palpitations and leg swelling.  Gastrointestinal:  Negative.   Endocrine: Negative for cold intolerance, heat intolerance, polydipsia, polyphagia and polyuria.  Neurological: Negative.   Psychiatric/Behavioral: Negative.     Per HPI unless specifically indicated above     Objective:    BP 122/84   Pulse (!) 53   Temp 97.7 F (36.5 C) (Oral)   Wt (!) 349 lb 6.4 oz (158.5 kg)   SpO2 99%   BMI 47.39 kg/m   Wt Readings from Last 3 Encounters:  07/14/20 (!) 349 lb 6.4 oz (158.5 kg)  03/29/20 (!) 310 lb (140.6 kg)  01/01/20 (!) 322 lb 12.8 oz (146.4 kg)    Physical Exam Vitals and nursing note reviewed.  Constitutional:      General: He is awake. He is not in acute distress.    Appearance: He is well-developed. He is morbidly obese. He is not ill-appearing.  HENT:     Head: Normocephalic and atraumatic.     Right Ear: Hearing normal. No drainage.     Left Ear: Hearing normal. No drainage.  Eyes:     General: Lids are normal.        Right eye: No discharge.        Left eye: No discharge.     Conjunctiva/sclera: Conjunctivae normal.     Pupils: Pupils are equal, round, and reactive to light.  Neck:     Vascular: No carotid bruit.  Cardiovascular:     Rate and Rhythm: Normal rate and regular rhythm.     Heart sounds: Normal heart sounds, S1 normal and S2 normal. No murmur heard. No gallop.   Pulmonary:  Effort: Pulmonary effort is normal. No accessory muscle usage or respiratory distress.     Breath sounds: Normal breath sounds.  Abdominal:     General: Bowel sounds are normal.     Palpations: Abdomen is soft.  Musculoskeletal:        General: Normal range of motion.     Cervical back: Normal range of motion and neck supple.     Right lower leg: No edema.     Left lower leg: No edema.  Skin:    General: Skin is warm and dry.  Neurological:     Mental Status: He is alert and oriented to person, place, and time.  Psychiatric:        Mood and Affect: Mood normal.        Behavior: Behavior normal. Behavior is  cooperative.        Thought Content: Thought content normal.        Judgment: Judgment normal.    Diabetic Foot Exam - Simple   Simple Foot Form Visual Inspection No deformities, no ulcerations, no other skin breakdown bilaterally: Yes Sensation Testing Intact to touch and monofilament testing bilaterally: Yes Pulse Check Posterior Tibialis and Dorsalis pulse intact bilaterally: Yes Comments     Results for orders placed or performed during the hospital encounter of 03/29/20  Urine culture   Specimen: Urine, Clean Catch  Result Value Ref Range   Specimen Description      URINE, CLEAN CATCH Performed at [facility], [address], [place], [place] [address]    Special Requests      Normal Performed at [facility], [address] [place], [place] [address]    Culture >=100,000 COLONIES/mL ESCHERICHIA COLI (A)    Report Status 04/01/2020 FINAL    Organism ID, Bacteria ESCHERICHIA COLI (A)       Susceptibility   Escherichia coli - MIC*    AMPICILLIN >=32 RESISTANT Resistant     CEFAZOLIN <=4 SENSITIVE Sensitive     CEFEPIME <=0.12 SENSITIVE Sensitive     CEFTRIAXONE <=0.25 SENSITIVE Sensitive     CIPROFLOXACIN <=0.25 SENSITIVE Sensitive     GENTAMICIN <=1 SENSITIVE Sensitive     IMIPENEM <=0.25 SENSITIVE Sensitive     NITROFURANTOIN <=16 SENSITIVE Sensitive     TRIMETH/SULFA <=20 SENSITIVE Sensitive     AMPICILLIN/SULBACTAM >=32 RESISTANT Resistant     PIP/TAZO <=4 SENSITIVE Sensitive     * >=100,000 COLONIES/mL ESCHERICHIA COLI  Urinalysis, Complete w Microscopic Urine, Clean Catch  Result Value Ref Range   Color, Urine YELLOW YELLOW   APPearance HAZY (A) CLEAR   Specific Gravity, Urine 1.020 1.005 - 1.030   pH 7.0 5.0 - 8.0   Glucose, UA NEGATIVE NEGATIVE mg/dL   Hgb urine dipstick TRACE (A) NEGATIVE   Bilirubin Urine NEGATIVE NEGATIVE   Ketones, ur NEGATIVE NEGATIVE mg/dL   Protein, ur NEGATIVE NEGATIVE mg/dL   Nitrite POSITIVE (A)  NEGATIVE   Leukocytes,Ua MODERATE (A) NEGATIVE   Squamous Epithelial / LPF NONE SEEN 0 - 5   WBC, UA 21-50 0 - 5 WBC/hpf   RBC / HPF 6-10 0 - 5 RBC/hpf   Bacteria, UA MANY (A) NONE SEEN      Assessment & Plan:   Problem List Items Addressed This Visit      Cardiovascular and Mediastinum   Hypertension associated with diabetes (HCC)    Chronic, stable with BP well below goal.  Will continue Lisinopril at 10 MG, maintain it  for kidney protection with proteinuria.  Check CMP and TSH today.  Recommend continued focus on diet and weight loss at home.  Return in 6 months.      Relevant Medications   lisinopril (ZESTRIL) 10 MG tablet   Other Relevant Orders   Bayer DCA Hb A1c Waived   Comprehensive metabolic panel   TSH     Endocrine   Hyperlipidemia associated with type 2 diabetes mellitus (HCC)    Chronic, stable without any current statin.  Is focused on diet and exercise.  Will recheck lipid panel today and consider statin if elevations.      Relevant Medications   lisinopril (ZESTRIL) 10 MG tablet   Other Relevant Orders   Bayer DCA Hb A1c Waived   Lipid Panel w/o Chol/HDL Ratio   Type 2 diabetes mellitus with obesity (HCC) - Primary    Chronic, stable with A1C today 5.3% without medication.  Praised for significant weight loss and recommended he continue focus as has gained a little back.  Continue to monitor BS at home occasionally and focus on diet and weight loss.  Return to office in 6 months.      Relevant Medications   lisinopril (ZESTRIL) 10 MG tablet   Other Relevant Orders   Bayer DCA Hb A1c Waived     Other   Morbid obesity (HCC)    With T2DM and HTN.  Recommended eating smaller high protein, low fat meals more frequently and exercising 30 mins a day 5 times a week with a goal of 10-15lb weight loss in the next 3 months. Patient voiced their understanding and motivation to adhere to these recommendations.       BMI 40.0-44.9, adult [facility])    Has gained some  back recently. Continue focus on diet and exercise.       Other Visit Diagnoses    Need for hepatitis C screening test       Hep C screening on labs today   Relevant Orders   Hepatitis C antibody   Need for Tdap vaccination       Tdap in office today   Relevant Orders   Tdap vaccine greater than or equal to 7yo IM       Follow up plan: Return in about 6 months (around 01/13/2021) for Annual physical.

## 2020-07-14 NOTE — Assessment & Plan Note (Signed)
With T2DM and HTN.  Recommended eating smaller high protein, low fat meals more frequently and exercising 30 mins a day 5 times a week with a goal of 10-15lb weight loss in the next 3 months. Patient voiced their understanding and motivation to adhere to these recommendations. ° °

## 2020-07-14 NOTE — Assessment & Plan Note (Signed)
Chronic, stable with BP well below goal.  Will continue Lisinopril at 10 MG, maintain it for kidney protection with proteinuria.  Check CMP and TSH today.  Recommend continued focus on diet and weight loss at home.  Return in 6 months.

## 2020-07-14 NOTE — Assessment & Plan Note (Signed)
Chronic, stable without any current statin.  Is focused on diet and exercise.  Will recheck lipid panel today and consider statin if elevations.

## 2020-07-14 NOTE — Assessment & Plan Note (Signed)
Chronic, stable with A1C today 5.3% without medication.  Praised for significant weight loss and recommended he continue focus as has gained a little back.  Continue to monitor BS at home occasionally and focus on diet and weight loss.  Return to office in 6 months.

## 2020-07-14 NOTE — Patient Instructions (Signed)
Diabetes Mellitus and Nutrition, Adult When you have diabetes, or diabetes mellitus, it is very important to have healthy eating habits because your blood sugar (glucose) levels are greatly affected by what you eat and drink. Eating healthy foods in the right amounts, at about the same times every day, can help you:  Control your blood glucose.  Lower your risk of heart disease.  Improve your blood pressure.  Reach or maintain a healthy weight. What can affect my meal plan? Every person with diabetes is different, and each person has different needs for a meal plan. Your health care provider may recommend that you work with a dietitian to make a meal plan that is best for you. Your meal plan may vary depending on factors such as:  The calories you need.  The medicines you take.  Your weight.  Your blood glucose, blood pressure, and cholesterol levels.  Your activity level.  Other health conditions you have, such as heart or kidney disease. How do carbohydrates affect me? Carbohydrates, also called carbs, affect your blood glucose level more than any other type of food. Eating carbs naturally raises the amount of glucose in your blood. Carb counting is a method for keeping track of how many carbs you eat. Counting carbs is important to keep your blood glucose at a healthy level, especially if you use insulin or take certain oral diabetes medicines. It is important to know how many carbs you can safely have in each meal. This is different for every person. Your dietitian can help you calculate how many carbs you should have at each meal and for each snack. How does alcohol affect me? Alcohol can cause a sudden decrease in blood glucose (hypoglycemia), especially if you use insulin or take certain oral diabetes medicines. Hypoglycemia can be a life-threatening condition. Symptoms of hypoglycemia, such as sleepiness, dizziness, and confusion, are similar to symptoms of having too much  alcohol.  Do not drink alcohol if: ? Your health care provider tells you not to drink. ? You are pregnant, may be pregnant, or are planning to become pregnant.  If you drink alcohol: ? Do not drink on an empty stomach. ? Limit how much you use to:  0-1 drink a day for women.  0-2 drinks a day for men. ? Be aware of how much alcohol is in your drink. In the [place] one drink equals one 12 oz bottle of beer (355 mL), one 5 oz glass of wine (148 mL), or one 1 oz glass of hard liquor (44 mL). ? Keep yourself hydrated with water, diet soda, or unsweetened iced tea.  Keep in mind that regular soda, juice, and other mixers may contain a lot of sugar and must be counted as carbs. What are tips for following this plan? Reading food labels  Start by checking the serving size on the "Nutrition Facts" label of packaged foods and drinks. The amount of calories, carbs, fats, and other nutrients listed on the label is based on one serving of the item. Many items contain more than one serving per package.  Check the total grams (g) of carbs in one serving. You can calculate the number of servings of carbs in one serving by dividing the total carbs by 15. For example, if a food has 30 g of total carbs per serving, it would be equal to 2 servings of carbs.  Check the number of grams (g) of saturated fats and trans fats in one serving. Choose foods that have   a low amount or none of these fats.  Check the number of milligrams (mg) of salt (sodium) in one serving. Most people should limit total sodium intake to less than 2,300 mg per day.  Always check the nutrition information of foods labeled as "low-fat" or "nonfat." These foods may be higher in added sugar or refined carbs and should be avoided.  Talk to your dietitian to identify your daily goals for nutrients listed on the label. Shopping  Avoid buying canned, pre-made, or processed foods. These foods tend to be high in fat, sodium, and added  sugar.  Shop around the outside edge of the grocery store. This is where you will most often find fresh fruits and vegetables, bulk grains, fresh meats, and fresh dairy. Cooking  Use low-heat cooking methods, such as baking, instead of high-heat cooking methods like deep frying.  Cook using healthy oils, such as olive, canola, or sunflower oil.  Avoid cooking with butter, cream, or high-fat meats. Meal planning  Eat meals and snacks regularly, preferably at the same times every day. Avoid going long periods of time without eating.  Eat foods that are high in fiber, such as fresh fruits, vegetables, beans, and whole grains. Talk with your dietitian about how many servings of carbs you can eat at each meal.  Eat 4-6 oz (112-168 g) of lean protein each day, such as lean meat, chicken, fish, eggs, or tofu. One ounce (oz) of lean protein is equal to: ? 1 oz (28 g) of meat, chicken, or fish. ? 1 egg. ?  cup (62 g) of tofu.  Eat some foods each day that contain healthy fats, such as avocado, nuts, seeds, and fish.   What foods should I eat? Fruits Berries. Apples. Oranges. Peaches. Apricots. Plums. Grapes. Mango. Papaya. Pomegranate. Kiwi. Cherries. Vegetables Lettuce. Spinach. Leafy greens, including kale, chard, collard greens, and mustard greens. Beets. Cauliflower. Cabbage. Broccoli. Carrots. Green beans. Tomatoes. Peppers. Onions. Cucumbers. Brussels sprouts. Grains Whole grains, such as whole-wheat or whole-grain bread, crackers, tortillas, cereal, and pasta. Unsweetened oatmeal. Quinoa. Brown or wild rice. Meats and other proteins Seafood. Poultry without skin. Lean cuts of poultry and beef. Tofu. Nuts. Seeds. Dairy Low-fat or fat-free dairy products such as milk, yogurt, and cheese. The items listed above may not be a complete list of foods and beverages you can eat. Contact a dietitian for more information. What foods should I avoid? Fruits Fruits canned with  syrup. Vegetables Canned vegetables. Frozen vegetables with butter or cream sauce. Grains Refined white flour and flour products such as bread, pasta, snack foods, and cereals. Avoid all processed foods. Meats and other proteins Fatty cuts of meat. Poultry with skin. Breaded or fried meats. Processed meat. Avoid saturated fats. Dairy Full-fat yogurt, cheese, or milk. Beverages Sweetened drinks, such as soda or iced tea. The items listed above may not be a complete list of foods and beverages you should avoid. Contact a dietitian for more information. Questions to ask a health care provider  Do I need to meet with a diabetes educator?  Do I need to meet with a dietitian?  What number can I call if I have questions?  When are the best times to check my blood glucose? Where to find more information:  American Diabetes Association: diabetes.org  Academy of Nutrition and Dietetics: www.eatright.org  National Institute of Diabetes and Digestive and Kidney Diseases: www.niddk.nih.gov  Association of Diabetes Care and Education Specialists: www.diabeteseducator.org Summary  It is important to have healthy eating   habits because your blood sugar (glucose) levels are greatly affected by what you eat and drink.  A healthy meal plan will help you control your blood glucose and maintain a healthy lifestyle.  Your health care provider may recommend that you work with a dietitian to make a meal plan that is best for you.  Keep in mind that carbohydrates (carbs) and alcohol have immediate effects on your blood glucose levels. It is important to count carbs and to use alcohol carefully. This information is not intended to replace advice given to you by your health care provider. Make sure you discuss any questions you have with your health care provider. Document Revised: 02/10/2019 Document Reviewed: 02/10/2019 Elsevier Patient Education  2021 Elsevier Inc.  

## 2020-07-14 NOTE — Assessment & Plan Note (Addendum)
Has gained some back recently. Continue focus on diet and exercise.

## 2020-07-15 LAB — COMPREHENSIVE METABOLIC PANEL
ALT: 29 IU/L (ref 0–44)
AST: 21 IU/L (ref 0–40)
Albumin/Globulin Ratio: 2.5 — ABNORMAL HIGH (ref 1.2–2.2)
Albumin: 4.5 g/dL (ref 4.0–5.0)
Alkaline Phosphatase: 73 IU/L (ref 44–121)
BUN/Creatinine Ratio: 14 (ref 9–20)
BUN: 14 mg/dL (ref 6–20)
Bilirubin Total: 0.4 mg/dL (ref 0.0–1.2)
CO2: 24 mmol/L (ref 20–29)
Calcium: 9.2 mg/dL (ref 8.7–10.2)
Chloride: 103 mmol/L (ref 96–106)
Creatinine, Ser: 1 mg/dL (ref 0.76–1.27)
Globulin, Total: 1.8 g/dL (ref 1.5–4.5)
Glucose: 87 mg/dL (ref 65–99)
Potassium: 4.4 mmol/L (ref 3.5–5.2)
Sodium: 143 mmol/L (ref 134–144)
Total Protein: 6.3 g/dL (ref 6.0–8.5)
eGFR: 99 mL/min/{1.73_m2} (ref >59)

## 2020-07-15 LAB — TSH: TSH: 1.41 u[IU]/mL (ref 0.450–4.500)

## 2020-07-15 LAB — LIPID PANEL W/O CHOL/HDL RATIO
Cholesterol, Total: 168 mg/dL (ref 100–199)
HDL: 36 mg/dL — ABNORMAL LOW (ref >39)
LDL Chol Calc (NIH): 108 mg/dL — ABNORMAL HIGH (ref 0–99)
Triglycerides: 136 mg/dL (ref 0–149)
VLDL Cholesterol Cal: 24 mg/dL (ref 5–40)

## 2020-07-15 LAB — HEPATITIS C ANTIBODY: Hep C Virus Ab: <0.1 s/co ratio (ref 0.0–0.9)

## 2020-07-15 NOTE — Progress Notes (Signed)
Contacted via MyChart   Good afternoon Brian Thornton, your labs have returned -- kidney function is normal and liver function as well.  Thyroid level normal.  Hep C is negative.  Lipid panel does show some elevation in LDL.  At this time with your diabetes, even though well controlled, as statin is recommended in all diabetics.  If you wish to wait and discuss further next visit that is okay and continue to work on diet.  The LDL is the bad cholesterol. Over time and in combination with inflammation and other factors, this contributes to plaque which in turn may lead to stroke and/or heart attack down the road. Sometimes high LDL is primarily genetic, and people might be eating all the right foods but still have high numbers. Other times, there is room for improvement in one's diet and eating healthier can bring this number down and potentially reduce one's risk of heart attack and/or stroke.   To reduce your LDL, Remember - more fruits and vegetables, more fish, and limit red meat and dairy products. More soy, nuts, beans, barley, lentils, oats and plant sterol ester enriched margarine instead of butter. I also encourage eliminating sugar and processed food. Remember, shop on the outside of the grocery store and visit your [employer]. If you would like to talk with me about dietary changes plus or minus medications for your cholesterol, please let me know. We should recheck your cholesterol in 6 months. Any questions? Keep being awesome!!  Thank you for allowing me to participate in your care. Kindest regards, Ansh Fauble

## 2020-08-12 ENCOUNTER — Telehealth: Payer: Self-pay | Admitting: Nurse Practitioner

## 2020-08-12 NOTE — Telephone Encounter (Signed)
Harriett Sine calling from [facility] is calling to check on the status of a request of medical records on 08/11/20 CB- [phone] Case Id [phone]

## 2020-08-12 NOTE — Telephone Encounter (Signed)
Received fax on 08/12/2020 left form in bin to be taken by [facility] notified [facility].

## 2020-08-24 NOTE — Telephone Encounter (Signed)
Spoke with Foye Spurling gave her case ID [phone]

## 2021-01-17 ENCOUNTER — Encounter: Payer: 59 | Admitting: Nurse Practitioner

## 2021-01-17 DIAGNOSIS — E1169 Type 2 diabetes mellitus with other specified complication: Secondary | ICD-10-CM

## 2021-01-17 DIAGNOSIS — Z0000 Encounter for general adult medical examination without abnormal findings: Secondary | ICD-10-CM

## 2021-01-17 DIAGNOSIS — I152 Hypertension secondary to endocrine disorders: Secondary | ICD-10-CM

## 2021-01-17 DIAGNOSIS — M1A0720 Idiopathic chronic gout, left ankle and foot, without tophus (tophi): Secondary | ICD-10-CM

## 2021-02-16 ENCOUNTER — Ambulatory Visit: Payer: Self-pay

## 2021-02-16 ENCOUNTER — Other Ambulatory Visit: Payer: Self-pay | Admitting: Family Medicine

## 2021-02-16 ENCOUNTER — Other Ambulatory Visit: Payer: Self-pay

## 2021-02-16 DIAGNOSIS — Z0000 Encounter for general adult medical examination without abnormal findings: Secondary | ICD-10-CM

## 2021-08-02 ENCOUNTER — Other Ambulatory Visit: Payer: Self-pay | Admitting: Nurse Practitioner

## 2021-08-02 NOTE — Telephone Encounter (Signed)
Call to patient- scheduled appointment- courtesy 30 day Rx given ?( Rx expired- 07/14/20 #90 4RF- but since RF would cover this time period allowed courtesy ?Requested Prescriptions  ?Pending Prescriptions Disp Refills  ?? lisinopril (ZESTRIL) 10 MG tablet [Pharmacy Med Name: LISINOPRIL 10 MG TABLET] 30 tablet 0  ?  Sig: Take 1 tablet (10 mg total) by mouth daily.  ?  ? Cardiovascular:  ACE Inhibitors Failed - 08/02/2021  8:38 AM  ?  ?  Failed - Cr in normal range and within 180 days  ?  Creatinine, Ser  ?Date Value Ref Range Status  ?07/14/2020 1.00 0.76 - 1.27 mg/dL Final  ?   ?  ?  Failed - K in normal range and within 180 days  ?  Potassium  ?Date Value Ref Range Status  ?07/14/2020 4.4 3.5 - 5.2 mmol/L Final  ?   ?  ?  Failed - Valid encounter within last 6 months  ?  Recent Outpatient Visits   ?      ? 1 year ago Type 2 diabetes mellitus with obesity (HCC)  ? [facility] [place], Corrie Dandy T, NP  ? 1 year ago COVID  ? [facility] [place] Dense M, DO  ? 1 year ago Type 2 diabetes mellitus with obesity (HCC)  ? [facility] [place], [place] T, NP  ? 1 year ago Type 2 diabetes mellitus with obesity (HCC)  ? [facility] [place], Corrie Dandy T, NP  ? 2 years ago Type 2 diabetes mellitus without complication, without long-term current use of insulin (HCC)  ? [facility] [place] Cirri, NP  ?  ?  ?Future Appointments   ?        ? In 2 days Marjie Skiff, NP [employer], [facility]  ?  ? ?  ?  ?  Passed - Patient is not pregnant  ?  ?  Passed - Last BP in normal range  ?  BP Readings from Last 1 Encounters:  ?07/14/20 122/84  ?   ?  ?  ? ?

## 2021-08-04 ENCOUNTER — Ambulatory Visit (INDEPENDENT_AMBULATORY_CARE_PROVIDER_SITE_OTHER): Payer: BC Managed Care – PPO | Admitting: Nurse Practitioner

## 2021-08-04 ENCOUNTER — Other Ambulatory Visit: Payer: Self-pay | Admitting: Nurse Practitioner

## 2021-08-04 ENCOUNTER — Encounter: Payer: Self-pay | Admitting: Nurse Practitioner

## 2021-08-04 VITALS — BP 101/63 | HR 60 | Temp 97.7°F | Ht 73.25 in | Wt 378.6 lb

## 2021-08-04 DIAGNOSIS — I152 Hypertension secondary to endocrine disorders: Secondary | ICD-10-CM

## 2021-08-04 DIAGNOSIS — E669 Obesity, unspecified: Secondary | ICD-10-CM

## 2021-08-04 DIAGNOSIS — E1169 Type 2 diabetes mellitus with other specified complication: Secondary | ICD-10-CM | POA: Diagnosis not present

## 2021-08-04 DIAGNOSIS — E785 Hyperlipidemia, unspecified: Secondary | ICD-10-CM | POA: Diagnosis not present

## 2021-08-04 DIAGNOSIS — E1159 Type 2 diabetes mellitus with other circulatory complications: Secondary | ICD-10-CM | POA: Diagnosis not present

## 2021-08-04 DIAGNOSIS — Z0000 Encounter for general adult medical examination without abnormal findings: Secondary | ICD-10-CM | POA: Diagnosis not present

## 2021-08-04 DIAGNOSIS — Z6841 Body Mass Index (BMI) 40.0 and over, adult: Secondary | ICD-10-CM

## 2021-08-04 LAB — MICROALBUMIN, URINE WAIVED
Creatinine, Urine Waived: 200 mg/dL (ref 10–300)
Microalb, Ur Waived: 80 mg/L — ABNORMAL HIGH (ref 0–19)

## 2021-08-04 LAB — BAYER DCA HB A1C WAIVED: HB A1C (BAYER DCA - WAIVED): 5.2 % (ref 4.8–5.6)

## 2021-08-04 MED ORDER — LISINOPRIL 10 MG PO TABS: 10.0000 mg | ORAL_TABLET | Freq: Every day | ORAL | 4 refills | Status: DC

## 2021-08-04 NOTE — Assessment & Plan Note (Signed)
Has gained 30 pounds back. Continue focus on diet and exercise.

## 2021-08-04 NOTE — Progress Notes (Signed)
Contacted via [place] morning Brian Thornton, I have good news your A1c remains stable at 5.2%!!!  Wonderful!!!  Urine does continue to show protein present.  Please ensure you take your Lisinopril daily to help protect kidneys.  If you would like to discuss in future we could always consider Ozempic injections weekly in future which will help maintain stable diabetes, but also help with reducing appetite and weight loss -- sometime to ponder:) Any questions? Keep being stellar!!  Thank you for allowing me to participate in your care.  I appreciate you. Kindest regards, Raylynne Cubbage

## 2021-08-04 NOTE — Patient Instructions (Signed)

## 2021-08-04 NOTE — Assessment & Plan Note (Signed)
Chronic, stable with BP well below goal.  Continue Lisinopril at 10 MG, maintain it for kidney protection with proteinuria.  Check CBC, CMP and TSH today.  Recommend continued focus on diet and weight loss at home.  Return in 6 months.

## 2021-08-04 NOTE — Assessment & Plan Note (Signed)
Chronic, ongoing, refuses statin.  Is focused on diet and exercise.  Will recheck lipid panel today and consider statin if elevations. 

## 2021-08-04 NOTE — Assessment & Plan Note (Signed)
BMI 49.61 with T2DM and HTN.  Recommended eating smaller high protein, low fat meals more frequently and exercising 30 mins a day 5 times a week with a goal of 10-15lb weight loss in the next 3 months. Patient voiced their understanding and motivation to adhere to these recommendations.

## 2021-08-04 NOTE — Assessment & Plan Note (Signed)
Chronic, ongoing with A1c 5.3% at last visit in April 2022, lost to follow-up since then.   - Will recheck A1c and urine ALB today, may need to restart Metformin or consider GLP1 if elevation in A1c present.   Has gained 30 pounds since last visit. - Is on ACE for kidney protection, continue this.  No current statin, refuses - Needs eye exam, discussed with patient.  Foot exam up to date. - Continue to monitor BS at home occasionally and focus on diet and weight loss.  Return to office in 6 months.

## 2021-08-04 NOTE — Progress Notes (Signed)
BP 101/63   Pulse 60   Temp 97.7 F (36.5 C) (Oral)   Ht 6' 1.25" (1.861 m)   Wt (!) 378 lb 9.6 oz (171.7 kg)   SpO2 95%   BMI 49.61 kg/m    Subjective:    Patient ID: Brian Thornton, male    DOB: [birth year], 41 y.o.   MRN: [id number]  HPI: Brian Thornton is a 41 y.o. male presenting on 08/04/2021 for comprehensive medical examination. Current medical complaints include:none  He currently lives with: wife Interim Problems from his last visit: no  DIABETES No current medications.  A1c 5.3% in April.  Took Metformin in the past. Hypoglycemic episodes:no Polydipsia/polyuria: no Visual disturbance: no Chest pain: no Paresthesias: no Glucose Monitoring: no  Accucheck frequency: Not Checking  Fasting glucose:  Post prandial:  Evening:  Before meals: Taking Insulin?: no  Long acting insulin:  Short acting insulin: Blood Pressure Monitoring: not checking Retinal Examination: Not up to Date Foot Exam: Up to Date Diabetic Education: Completed Pneumovax: Not up to Date - refuses Influenza: Not Up to Date -- refuses Aspirin: no   HYPERTENSION / HYPERLIPIDEMIA Taking Lisinopril daily.  No statin. Satisfied with current treatment? yes Duration of hypertension: chronic BP monitoring frequency: not checking BP range:  BP medication side effects: no Duration of hyperlipidemia: chronic Aspirin: no Recent stressors: no Recurrent headaches: no Visual changes: no Palpitations: no Dyspnea: no Chest pain: no Lower extremity edema: no Dizzy/lightheaded: no   Functional Status Survey: Is the patient deaf or have difficulty hearing?: No Does the patient have difficulty seeing, even when wearing glasses/contacts?: No Does the patient have difficulty concentrating, remembering, or making decisions?: No Does the patient have difficulty walking or climbing stairs?: No Does the patient have difficulty dressing or bathing?: No Does the patient have difficulty doing errands  alone such as visiting a doctor's office or shopping?: No  FALL RISK:    08/04/2021    8:47 AM 08/04/2021    8:35 AM 05/30/2018    1:25 PM 08/23/2017    2:11 PM 01/29/2017    8:33 AM  Fall Risk   Falls in the past year? 0 0 0 No No  Number falls in past yr: 0 0 0    Injury with Fall? 0 0 0    Risk for fall due to : No Fall Risks No Fall Risks     Follow up Falls evaluation completed Falls evaluation completed       Depression Screen    08/04/2021    8:34 AM 07/14/2020   10:21 AM 06/05/2019    9:06 AM 08/23/2017    2:11 PM 01/29/2017    8:33 AM  Depression screen PHQ 2/9  Decreased Interest 0 0 0 0 0  Down, Depressed, Hopeless 0 0 0 0 0  PHQ - 2 Score 0 0 0 0 0  Altered sleeping 0    0  Tired, decreased energy 0    0  Change in appetite 2    0  Feeling bad or failure about yourself  0    0  Trouble concentrating 0    0  Moving slowly or fidgety/restless 0    0  Suicidal thoughts 0    0  PHQ-9 Score 2    0    Advanced Directives <no information>  Past Medical History:  Past Medical History:  Diagnosis Date   Dysmetabolic syndrome    Extreme obesity    Gout  Hyperlipidemia    Hypertension    IFG (impaired fasting glucose)     Surgical History:  Past Surgical History:  Procedure Laterality Date   ORIF ANKLE FRACTURE Left 11/03/2018   Procedure: LEFT ANKLE STABILIZATION;  Surgeon: Corky Mull, MD;  Location: [facility] ORS;  Service: Orthopedics;  Laterality: Left;    Medications:  Current Outpatient Medications on File Prior to Visit  Medication Sig   fexofenadine (ALLEGRA) 180 MG tablet Take 180 mg by mouth daily.   lisinopril (ZESTRIL) 10 MG tablet Take 1 tablet (10 mg total) by mouth daily.   triamcinolone cream (KENALOG) 0.1 % Apply 1 application topically 2 (two) times daily. (Patient not taking: Reported on 08/04/2021)   No current facility-administered medications on file prior to visit.    Allergies:  Allergies  Allergen Reactions   Febuxostat Other  (See Comments)    Caused more flares    Social History:  Social History   Socioeconomic History   Marital status: Married    Spouse name: Not on file   Number of children: Not on file   Years of education: Not on file   Highest education level: Not on file  Occupational History   Not on file  Tobacco Use   Smoking status: Never   Smokeless tobacco: Never  Vaping Use   Vaping Use: Never used  Substance and Sexual Activity   Alcohol use: No    Alcohol/week: 0.0 standard drinks   Drug use: No   Sexual activity: Yes  Other Topics Concern   Not on file  Social History Narrative   Not on file   Social Determinants of Health   Financial Resource Strain: Not on file  Food Insecurity: Not on file  Transportation Needs: Not on file  Physical Activity: Not on file  Stress: Not on file  Social Connections: Not on file  Intimate Partner Violence: Not on file   Social History   Tobacco Use  Smoking Status Never  Smokeless Tobacco Never   Social History   Substance and Sexual Activity  Alcohol Use No   Alcohol/week: 0.0 standard drinks    Family History:  Family History  Problem Relation Age of Onset   Diabetes Father    Gout Brother    Heart attack Maternal Grandmother    Cancer Maternal Grandfather        colon   Cancer Paternal Grandfather        prostate    Past medical history, surgical history, medications, allergies, family history and social history reviewed with patient today and changes made to appropriate areas of the chart.   ROS All other ROS negative except what is listed above and in the HPI.      Objective:    BP 101/63   Pulse 60   Temp 97.7 F (36.5 C) (Oral)   Ht 6' 1.25" (1.861 m)   Wt (!) 378 lb 9.6 oz (171.7 kg)   SpO2 95%   BMI 49.61 kg/m   Wt Readings from Last 3 Encounters:  08/04/21 (!) 378 lb 9.6 oz (171.7 kg)  07/14/20 (!) 349 lb 6.4 oz (158.5 kg)  03/29/20 (!) 310 lb (140.6 kg)    Physical Exam Vitals and nursing  note reviewed.  Constitutional:      General: He is awake. He is not in acute distress.    Appearance: He is well-developed and well-groomed. He is obese. He is not ill-appearing or toxic-appearing.  HENT:     Head:  Normocephalic and atraumatic.     Right Ear: Hearing, tympanic membrane, ear canal and external ear normal. No drainage.     Left Ear: Hearing, tympanic membrane, ear canal and external ear normal. No drainage.     Nose: Nose normal.     Mouth/Throat:     Pharynx: Uvula midline.  Eyes:     General: Lids are normal.        Right eye: No discharge.        Left eye: No discharge.     Extraocular Movements: Extraocular movements intact.     Conjunctiva/sclera: Conjunctivae normal.     Pupils: Pupils are equal, round, and reactive to light.     Visual Fields: Right eye visual fields normal and left eye visual fields normal.  Neck:     Thyroid: No thyromegaly.     Vascular: No carotid bruit or JVD.     Trachea: Trachea normal.  Cardiovascular:     Rate and Rhythm: Normal rate and regular rhythm.     Heart sounds: Normal heart sounds, S1 normal and S2 normal. No murmur heard.   No gallop.  Pulmonary:     Effort: Pulmonary effort is normal. No accessory muscle usage or respiratory distress.     Breath sounds: Normal breath sounds.  Abdominal:     General: Bowel sounds are normal.     Palpations: Abdomen is soft. There is no hepatomegaly or splenomegaly.     Tenderness: There is no abdominal tenderness.  Musculoskeletal:        General: Normal range of motion.     Cervical back: Normal range of motion and neck supple.     Right lower leg: No edema.     Left lower leg: No edema.  Lymphadenopathy:     Head:     Right side of head: No submental, submandibular, tonsillar, preauricular or posterior auricular adenopathy.     Left side of head: No submental, submandibular, tonsillar, preauricular or posterior auricular adenopathy.     Cervical: No cervical adenopathy.  Skin:     General: Skin is warm and dry.     Capillary Refill: Capillary refill takes less than 2 seconds.     Findings: No rash.  Neurological:     Mental Status: He is alert and oriented to person, place, and time.     Gait: Gait is intact.     Deep Tendon Reflexes: Reflexes are normal and symmetric.     Reflex Scores:      Brachioradialis reflexes are 2+ on the right side and 2+ on the left side.      Patellar reflexes are 2+ on the right side and 2+ on the left side. Psychiatric:        Attention and Perception: Attention normal.        Mood and Affect: Mood normal.        Speech: Speech normal.        Behavior: Behavior normal. Behavior is cooperative.        Thought Content: Thought content normal.        Cognition and Memory: Cognition normal.   Diabetic Foot Exam - Simple   Simple Foot Form Visual Inspection No deformities, no ulcerations, no other skin breakdown bilaterally: Yes Sensation Testing Intact to touch and monofilament testing bilaterally: Yes Pulse Check Posterior Tibialis and Dorsalis pulse intact bilaterally: Yes Comments     Results for orders placed or performed in visit on 07/14/20  Bayer [place] Hb  A1c Waived  Result Value Ref Range   HB A1C (BAYER DCA - WAIVED) 5.3 <7.0 %  Comprehensive metabolic panel  Result Value Ref Range   Glucose 87 65 - 99 mg/dL   BUN 14 6 - 20 mg/dL   Creatinine, Ser 1.00 0.76 - 1.27 mg/dL   eGFR 99 >59 mL/min/1.73   BUN/Creatinine Ratio 14 9 - 20   Sodium 143 134 - 144 mmol/L   Potassium 4.4 3.5 - 5.2 mmol/L   Chloride 103 96 - 106 mmol/L   CO2 24 20 - 29 mmol/L   Calcium 9.2 8.7 - 10.2 mg/dL   Total Protein 6.3 6.0 - 8.5 g/dL   Albumin 4.5 4.0 - 5.0 g/dL   Globulin, Total 1.8 1.5 - 4.5 g/dL   Albumin/Globulin Ratio 2.5 (H) 1.2 - 2.2   Bilirubin Total 0.4 0.0 - 1.2 mg/dL   Alkaline Phosphatase 73 44 - 121 IU/L   AST 21 0 - 40 IU/L   ALT 29 0 - 44 IU/L  Lipid Panel w/o Chol/HDL Ratio  Result Value Ref Range   Cholesterol,  Total 168 100 - 199 mg/dL   Triglycerides 136 0 - 149 mg/dL   HDL 36 (L) >39 mg/dL   VLDL Cholesterol Cal 24 5 - 40 mg/dL   LDL Chol Calc (NIH) 108 (H) 0 - 99 mg/dL  TSH  Result Value Ref Range   TSH 1.410 0.450 - 4.500 uIU/mL  Hepatitis C antibody  Result Value Ref Range   Hep C Virus Ab <0.1 0.0 - 0.9 s/co ratio      Assessment & Plan:   Problem List Items Addressed This Visit       Cardiovascular and Mediastinum   Hypertension associated with diabetes ([place])    Chronic, stable with BP well below goal.  Continue Lisinopril at 10 MG, maintain it for kidney protection with proteinuria.  Check CBC, CMP and TSH today.  Recommend continued focus on diet and weight loss at home.  Return in 6 months.       Relevant Orders   Bayer DCA Hb A1c Waived   Microalbumin, Urine Waived   Comprehensive metabolic panel   CBC with Differential/Platelet   TSH     Endocrine   Hyperlipidemia associated with type 2 diabetes mellitus (HCC)    Chronic, ongoing, refuses statin.  Is focused on diet and exercise.  Will recheck lipid panel today and consider statin if elevations.       Relevant Orders   Bayer DCA Hb A1c Waived   Comprehensive metabolic panel   Lipid Panel w/o Chol/HDL Ratio   Type 2 diabetes mellitus with obesity ([place]) - Primary    Chronic, ongoing with A1c 5.3% at last visit in April 2022, lost to follow-up since then.   - Will recheck A1c and urine ALB today, may need to restart Metformin or consider GLP1 if elevation in A1c present.   Has gained 30 pounds since last visit. - Is on ACE for kidney protection, continue this.  No current statin, refuses - Needs eye exam, discussed with patient.  Foot exam up to date. - Continue to monitor BS at home occasionally and focus on diet and weight loss.  Return to office in 6 months.       Relevant Orders   Bayer DCA Hb A1c Waived   Microalbumin, Urine Waived     Other   BMI 40.0-44.9, adult ([place])    Has gained 30 pounds back.  Continue focus  on diet and exercise.       Morbid obesity (HCC)    BMI 49.61 with T2DM and HTN.  Recommended eating smaller high protein, low fat meals more frequently and exercising 30 mins a day 5 times a week with a goal of 10-15lb weight loss in the next 3 months. Patient voiced their understanding and motivation to adhere to these recommendations.        Other Visit Diagnoses     Encounter for annual physical exam       Annual physical today with labs and health maintenance reviewed with patient.        Discussed aspirin prophylaxis for myocardial infarction prevention and decision was it was not indicated  LABORATORY TESTING:  Health maintenance labs ordered today as discussed above.   IMMUNIZATIONS:   - Tdap: Tetanus vaccination status reviewed: last tetanus booster within 10 years. - Influenza: Refused - Pneumovax: Refused - Prevnar: Not applicable - Zostavax vaccine: Not applicable  SCREENING: - Colonoscopy: Not applicable  Discussed with patient purpose of the colonoscopy is to detect colon cancer at curable precancerous or early stages   - AAA Screening: Not applicable  -Hearing Test: Not applicable  -Spirometry: Not applicable   PATIENT COUNSELING:    Sexuality: Discussed sexually transmitted diseases, partner selection, use of condoms, avoidance of unintended pregnancy  and contraceptive alternatives.   Advised to avoid cigarette smoking.  I discussed with the patient that most people either abstain from alcohol or drink within safe limits (<=14/week and <=4 drinks/occasion for males, <=7/weeks and <= 3 drinks/occasion for females) and that the risk for alcohol disorders and other health effects rises proportionally with the number of drinks per week and how often a drinker exceeds daily limits.  Discussed cessation/primary prevention of drug use and availability of treatment for abuse.   Diet: Encouraged to adjust caloric intake to maintain  or achieve  ideal body weight, to reduce intake of dietary saturated fat and total fat, to limit sodium intake by avoiding high sodium foods and not adding table salt, and to maintain adequate dietary potassium and calcium preferably from fresh fruits, vegetables, and low-fat dairy products.    Stressed the importance of regular exercise  Injury prevention: Discussed safety belts, safety helmets, smoke detector, smoking near bedding or upholstery.   Dental health: Discussed importance of regular tooth brushing, flossing, and dental visits.   Follow up plan: NEXT PREVENTATIVE PHYSICAL DUE IN 1 YEAR. Return in about 6 months (around 02/04/2022) for T2DM, HTN/HLD, OBESITY.

## 2021-08-05 LAB — LIPID PANEL W/O CHOL/HDL RATIO
Cholesterol, Total: 180 mg/dL (ref 100–199)
HDL: 34 mg/dL — ABNORMAL LOW (ref >39)
LDL Chol Calc (NIH): 120 mg/dL — ABNORMAL HIGH (ref 0–99)
Triglycerides: 144 mg/dL (ref 0–149)
VLDL Cholesterol Cal: 26 mg/dL (ref 5–40)

## 2021-08-05 LAB — COMPREHENSIVE METABOLIC PANEL
ALT: 34 IU/L (ref 0–44)
AST: 21 IU/L (ref 0–40)
Albumin/Globulin Ratio: 1.7 (ref 1.2–2.2)
Albumin: 4.5 g/dL (ref 4.0–5.0)
Alkaline Phosphatase: 78 IU/L (ref 44–121)
BUN/Creatinine Ratio: 16 (ref 9–20)
BUN: 17 mg/dL (ref 6–20)
Bilirubin Total: 0.4 mg/dL (ref 0.0–1.2)
CO2: 23 mmol/L (ref 20–29)
Calcium: 9.4 mg/dL (ref 8.7–10.2)
Chloride: 103 mmol/L (ref 96–106)
Creatinine, Ser: 1.05 mg/dL (ref 0.76–1.27)
Globulin, Total: 2.6 g/dL (ref 1.5–4.5)
Glucose: 94 mg/dL (ref 70–99)
Potassium: 4.1 mmol/L (ref 3.5–5.2)
Sodium: 141 mmol/L (ref 134–144)
Total Protein: 7.1 g/dL (ref 6.0–8.5)
eGFR: 93 mL/min/{1.73_m2} (ref >59)

## 2021-08-05 LAB — CBC WITH DIFFERENTIAL/PLATELET
Basophils Absolute: 0 10*3/uL (ref 0.0–0.2)
Basos: 1 %
EOS (ABSOLUTE): 0.3 10*3/uL (ref 0.0–0.4)
Eos: 4 %
Hematocrit: 51.1 % — ABNORMAL HIGH (ref 37.5–51.0)
Hemoglobin: 17.2 g/dL (ref 13.0–17.7)
Immature Grans (Abs): 0.1 10*3/uL (ref 0.0–0.1)
Immature Granulocytes: 1 %
Lymphocytes Absolute: 1.9 10*3/uL (ref 0.7–3.1)
Lymphs: 32 %
MCH: 28.6 pg (ref 26.6–33.0)
MCHC: 33.7 g/dL (ref 31.5–35.7)
MCV: 85 fL (ref 79–97)
Monocytes Absolute: 0.5 10*3/uL (ref 0.1–0.9)
Monocytes: 8 %
Neutrophils Absolute: 3.3 10*3/uL (ref 1.4–7.0)
Neutrophils: 54 %
Platelets: 179 10*3/uL (ref 150–450)
RBC: 6.01 x10E6/uL — ABNORMAL HIGH (ref 4.14–5.80)
RDW: 14.1 % (ref 11.6–15.4)
WBC: 6 10*3/uL (ref 3.4–10.8)

## 2021-08-05 LAB — TSH: TSH: 1.61 u[IU]/mL (ref 0.450–4.500)

## 2021-08-06 NOTE — Progress Notes (Signed)
Contacted via [facility] [place], your labs have returned: - CBC shows no anemia or infection. - Kidney function, creatinine and eGFR, remains normal, as is liver function, AST and ALT.   - Thyroid lab is normal, TSH. - Cholesterol level is showing elevation in LDL.  This is the bad cholesterol, I would recommend heavy focus on diet reducing foods high in saturated fats, red meats, and fast foods.  Any questions? Keep being amazing!!  Thank you for allowing me to participate in your care.  I appreciate you. Kindest regards, Breland Trouten

## 2021-08-09 ENCOUNTER — Encounter: Payer: Self-pay | Admitting: Nurse Practitioner

## 2021-09-12 ENCOUNTER — Encounter: Payer: Self-pay | Admitting: Nurse Practitioner

## 2021-09-13 ENCOUNTER — Encounter: Payer: Self-pay | Admitting: Nurse Practitioner

## 2021-09-13 ENCOUNTER — Ambulatory Visit (INDEPENDENT_AMBULATORY_CARE_PROVIDER_SITE_OTHER): Payer: BC Managed Care – PPO | Admitting: Nurse Practitioner

## 2021-09-13 VITALS — BP 139/93 | HR 63 | Temp 98.6°F | Wt 374.0 lb

## 2021-09-13 DIAGNOSIS — M109 Gout, unspecified: Secondary | ICD-10-CM

## 2021-09-13 MED ORDER — INDOMETHACIN 50 MG PO CAPS: 50.0000 mg | ORAL_CAPSULE | Freq: Three times a day (TID) | ORAL | 1 refills | Status: DC | PRN

## 2021-09-13 NOTE — Progress Notes (Signed)
BP (!) 139/93   Pulse 63   Temp 98.6 F (37 C) (Oral)   Wt (!) 374 lb (169.6 kg)   SpO2 97%   BMI 49.01 kg/m    Subjective:    Patient ID: Brian Thornton, male    DOB: 03-10-1981, 41 y.o.   MRN: [id number]  HPI: Brian Thornton is a 40 y.o. male  Chief Complaint  Patient presents with   Elbow Pain    X few days. HX of gout. C/o pain, swelling, redness.    GOUT Left Elbow Duration:days Right 1st metatarsophalangeal pain: no Left 1st metatarsophalangeal pain: no Right knee pain: no Left knee pain: no Severity: 3/10  Quality: sharp Swelling: yes Redness: no Trauma: no Recent dietary change or indiscretion: no Fevers: no Nausea/vomiting: no Status:  better   Relevant past medical, surgical, family and social history reviewed and updated as indicated. Interim medical history since our last visit reviewed. Allergies and medications reviewed and updated.  Review of Systems  Musculoskeletal:        Left elbow pain and swelling    Per HPI unless specifically indicated above     Objective:    BP (!) 139/93   Pulse 63   Temp 98.6 F (37 C) (Oral)   Wt (!) 374 lb (169.6 kg)   SpO2 97%   BMI 49.01 kg/m   Wt Readings from Last 3 Encounters:  09/13/21 (!) 374 lb (169.6 kg)  08/04/21 (!) 378 lb 9.6 oz (171.7 kg)  07/14/20 (!) 349 lb 6.4 oz (158.5 kg)    Physical Exam Vitals and nursing note reviewed.  Constitutional:      General: He is not in acute distress.    Appearance: Normal appearance. He is not ill-appearing, toxic-appearing or diaphoretic.  HENT:     Head: Normocephalic.     Right Ear: External ear normal.     Left Ear: External ear normal.     Nose: Nose normal. No congestion or rhinorrhea.     Mouth/Throat:     Mouth: Mucous membranes are moist.  Eyes:     General:        Right eye: No discharge.        Left eye: No discharge.     Extraocular Movements: Extraocular movements intact.     Conjunctiva/sclera: Conjunctivae normal.      Pupils: Pupils are equal, round, and reactive to light.  Cardiovascular:     Rate and Rhythm: Normal rate and regular rhythm.     Heart sounds: No murmur heard. Pulmonary:     Effort: Pulmonary effort is normal. No respiratory distress.     Breath sounds: Normal breath sounds. No wheezing, rhonchi or rales.  Abdominal:     General: Abdomen is flat. Bowel sounds are normal.  Musculoskeletal:     Right elbow: Normal.     Left elbow: Swelling present. Normal range of motion. Tenderness (diffuse) present.       Arms:     Cervical back: Normal range of motion and neck supple.  Skin:    General: Skin is warm and dry.     Capillary Refill: Capillary refill takes less than 2 seconds.  Neurological:     General: No focal deficit present.     Mental Status: He is alert and oriented to person, place, and time.  Psychiatric:        Mood and Affect: Mood normal.        Behavior: Behavior normal.  Thought Content: Thought content normal.        Judgment: Judgment normal.     Results for orders placed or performed in visit on 08/04/21  Bayer DCA Hb A1c Waived  Result Value Ref Range   HB A1C (BAYER DCA - WAIVED) 5.2 4.8 - 5.6 %  Microalbumin, Urine Waived  Result Value Ref Range   Microalb, Ur Waived 80 (H) 0 - 19 mg/L   Creatinine, Urine Waived 200 10 - 300 mg/dL   Microalb/Creat Ratio 30-300 (H) <30 mg/g  Comprehensive metabolic panel  Result Value Ref Range   Glucose 94 70 - 99 mg/dL   BUN 17 6 - 20 mg/dL   Creatinine, Ser 1.05 0.76 - 1.27 mg/dL   eGFR 93 >59 mL/min/1.73   BUN/Creatinine Ratio 16 9 - 20   Sodium 141 134 - 144 mmol/L   Potassium 4.1 3.5 - 5.2 mmol/L   Chloride 103 96 - 106 mmol/L   CO2 23 20 - 29 mmol/L   Calcium 9.4 8.7 - 10.2 mg/dL   Total Protein 7.1 6.0 - 8.5 g/dL   Albumin 4.5 4.0 - 5.0 g/dL   Globulin, Total 2.6 1.5 - 4.5 g/dL   Albumin/Globulin Ratio 1.7 1.2 - 2.2   Bilirubin Total 0.4 0.0 - 1.2 mg/dL   Alkaline Phosphatase 78 44 - 121 IU/L    AST 21 0 - 40 IU/L   ALT 34 0 - 44 IU/L  CBC with Differential/Platelet  Result Value Ref Range   WBC 6.0 3.4 - 10.8 x10E3/uL   RBC 6.01 (H) 4.14 - 5.80 x10E6/uL   Hemoglobin 17.2 13.0 - 17.7 g/dL   Hematocrit 51.1 (H) 37.5 - 51.0 %   MCV 85 79 - 97 fL   MCH 28.6 26.6 - 33.0 pg   MCHC 33.7 31.5 - 35.7 g/dL   RDW 14.1 11.6 - 15.4 %   Platelets 179 150 - 450 x10E3/uL   Neutrophils 54 Not Estab. %   Lymphs 32 Not Estab. %   Monocytes 8 Not Estab. %   Eos 4 Not Estab. %   Basos 1 Not Estab. %   Neutrophils Absolute 3.3 1.4 - 7.0 x10E3/uL   Lymphocytes Absolute 1.9 0.7 - 3.1 x10E3/uL   Monocytes Absolute 0.5 0.1 - 0.9 x10E3/uL   EOS (ABSOLUTE) 0.3 0.0 - 0.4 x10E3/uL   Basophils Absolute 0.0 0.0 - 0.2 x10E3/uL   Immature Granulocytes 1 Not Estab. %   Immature Grans (Abs) 0.1 0.0 - 0.1 x10E3/uL  Lipid Panel w/o Chol/HDL Ratio  Result Value Ref Range   Cholesterol, Total 180 100 - 199 mg/dL   Triglycerides 144 0 - 149 mg/dL   HDL 34 (L) >39 mg/dL   VLDL Cholesterol Cal 26 5 - 40 mg/dL   LDL Chol Calc (NIH) 120 (H) 0 - 99 mg/dL  TSH  Result Value Ref Range   TSH 1.610 0.450 - 4.500 uIU/mL      Assessment & Plan:   Problem List Items Addressed This Visit       Musculoskeletal and Integument   Acute gout of left elbow - Primary    Has been on Indomethicin for a couple of days. Refilled today. Symptoms are already improving.  Follow up if symptoms do not improve.      Relevant Medications   indomethacin (INDOCIN) 50 MG capsule     Follow up plan: Return if symptoms worsen or fail to improve.

## 2021-09-13 NOTE — Assessment & Plan Note (Signed)
Has been on Indomethicin for a couple of days. Refilled today. Symptoms are already improving.  Follow up if symptoms do not improve.

## 2021-12-15 IMAGING — DX DG CHEST 1V
1 series · 1 of 1 positions shown · non-contrast
Comparison: None.

CLINICAL DATA: Physical exam

EXAM:
CHEST  1 VIEW

[chest pa]
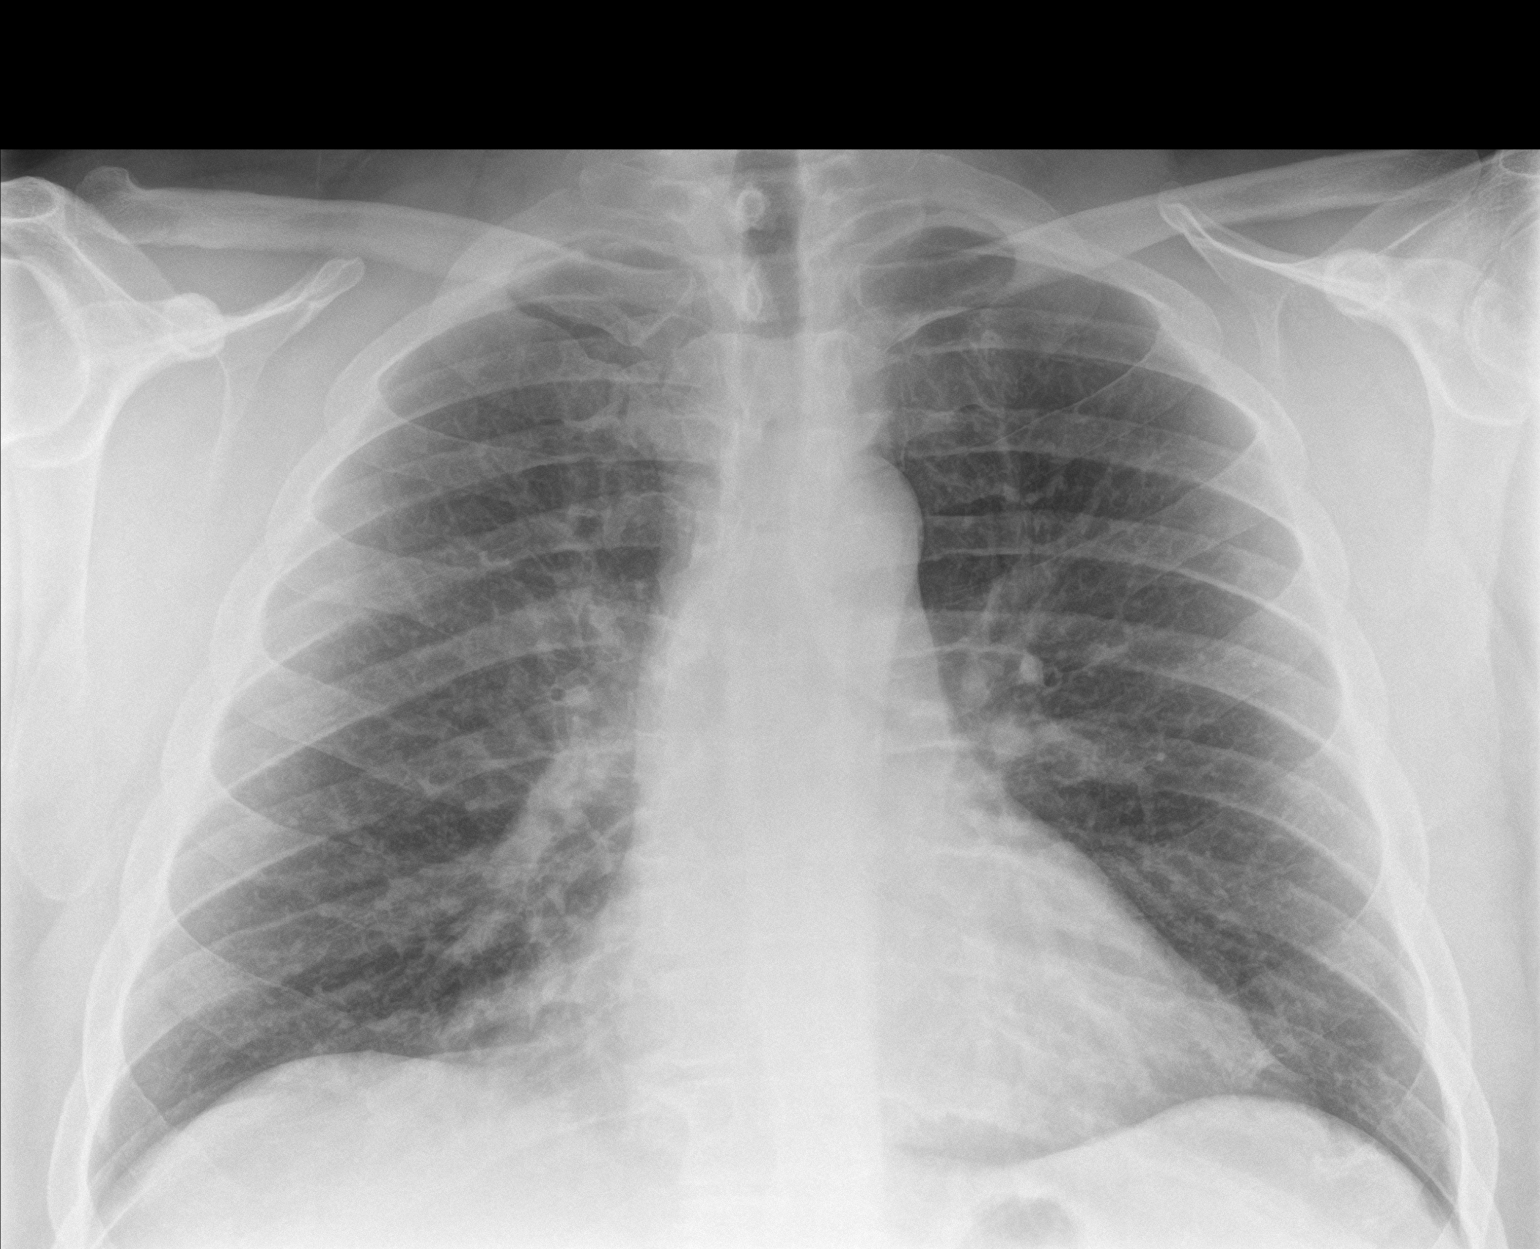

[1 of 1 positions shown; findings below may reference images not displayed]

FINDINGS: Heart size and mediastinal contours are within normal limits. No
suspicious pulmonary opacities identified.

No pleural effusion or pneumothorax visualized.

No acute osseous abnormality appreciated.
IMPRESSION: No acute intrathoracic process identified.

## 2022-02-05 ENCOUNTER — Ambulatory Visit: Payer: BC Managed Care – PPO | Admitting: Nurse Practitioner

## 2022-05-15 ENCOUNTER — Other Ambulatory Visit: Payer: Self-pay

## 2022-05-15 ENCOUNTER — Ambulatory Visit
Admission: EM | Admit: 2022-05-15 | Discharge: 2022-05-15 | Disposition: A | Discharge status: Home / Self Care | Payer: BC Managed Care – PPO | Attending: Emergency Medicine | Admitting: Emergency Medicine

## 2022-05-15 DIAGNOSIS — H60391 Other infective otitis externa, right ear: Secondary | ICD-10-CM | POA: Diagnosis not present

## 2022-05-15 MED ORDER — CIPROFLOXACIN-DEXAMETHASONE 0.3-0.1 % OT SUSP: 4.0000 [drp] | Freq: Two times a day (BID) | OTIC | 0 refills | Status: DC

## 2022-05-15 MED ORDER — AMOXICILLIN 500 MG PO CAPS
500.0000 mg | ORAL_CAPSULE | Freq: Two times a day (BID) | ORAL | 0 refills | Status: AC
Start: 2022-05-15 — End: 2022-05-22

## 2022-05-15 MED ORDER — DEXAMETHASONE SODIUM PHOSPHATE 10 MG/ML IJ SOLN
10.0000 mg | Freq: Once | INTRAMUSCULAR | Status: AC
Start: 2022-05-15 — End: 2022-05-15
  Administered 2022-05-15: 10 mg via INTRAMUSCULAR

## 2022-05-15 NOTE — Discharge Instructions (Signed)
On exam you have significant swelling to your ear canal and I am unable to visualize your eardrum  You have been given an injection of Depo here in office to ideally help with pain and start to reduce the swelling in your ear  Begin use of Ciprodex placing 4 drops into the right ear twice daily for the next 7 days  Begin use of oral amoxicillin taking every morning and every evening for 7 days, ideally within the see improvement in about 48 hours and steady progression from there  You may take Tylenol and Motrin every 6 hours as needed for pain management  If symptoms continue to persist or worsen please follow-up for reevaluation

## 2022-05-15 NOTE — ED Provider Notes (Signed)
[facility]    CSN: [id number] Arrival date & time: 05/15/22  1106      History   Chief Complaint Chief Complaint  Patient presents with   Otalgia    HPI Brian Thornton is a 42 y.o. male.   Patient presents for evaluation of internal and external right ear pain beginning 3 days ago.  Associated decreased hearing with possible drainage.  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  Has attempted use of Tylenol which was ineffective.  Denies pruritus, fevers, nasal congestion.    Past Medical History:  Diagnosis Date   Dysmetabolic syndrome    Extreme obesity    Gout    Hyperlipidemia    Hypertension    IFG (impaired fasting glucose)     Patient Active Problem List   Diagnosis Date Noted   History of 2019 novel coronavirus disease (COVID-19) 07/01/2020   BMI 40.0-44.9, adult ([place]) 11/03/2018   Eczema 07/09/2016   Family history of colon cancer 02/21/2015   Hypertension associated with diabetes ([place]) 09/24/2014   Acute gout of left elbow 09/24/2014   Hyperlipidemia associated with type 2 diabetes mellitus ([place]) 09/24/2014   Type 2 diabetes mellitus with obesity ([place]) 09/24/2014   Morbid obesity ([place]) 09/24/2014    Past Surgical History:  Procedure Laterality Date   ORIF ANKLE FRACTURE Left 11/03/2018   Procedure: LEFT ANKLE STABILIZATION;  Surgeon: Corky Mull, MD;  Location: [facility] ORS;  Service: Orthopedics;  Laterality: Left;       Home Medications    Prior to Admission medications   Medication Sig Start Date End Date Taking? Authorizing Provider  fexofenadine (ALLEGRA) 180 MG tablet Take 180 mg by mouth daily.    [provider]  indomethacin (INDOCIN) 50 MG capsule Take 1 capsule (50 mg total) by mouth 3 (three) times daily as needed. As needed for Gout flares 09/13/21   Jon Billings, NP  lisinopril (ZESTRIL) 10 MG tablet Take 1 tablet (10 mg total) by mouth daily. 08/04/21   Cannady, Henrine Screws  T, NP  triamcinolone cream (KENALOG) 0.1 % Apply 1 application topically 2 (two) times daily. Patient not taking: Reported on 08/04/2021 01/01/20   Marnee Guarneri T, NP    Family History Family History  Problem Relation Age of Onset   Diabetes Father    Gout Brother    Heart attack Maternal Grandmother    Cancer Maternal Grandfather        colon   Cancer Paternal Grandfather        prostate    Social History Social History   Tobacco Use   Smoking status: Never   Smokeless tobacco: Never  Vaping Use   Vaping Use: Never used  Substance Use Topics   Alcohol use: No    Alcohol/week: 0.0 standard drinks of alcohol   Drug use: No     Allergies   Febuxostat   Review of Systems Review of Systems  HENT:  Positive for ear pain. Negative for congestion, dental problem, drooling, ear discharge, facial swelling, hearing loss, mouth sores, nosebleeds, postnasal drip, rhinorrhea, sinus pressure, sinus pain, sneezing, sore throat, tinnitus, trouble swallowing and voice change.   Respiratory: Negative.    Cardiovascular: Negative.      Physical Exam Triage Vital Signs ED Triage Vitals  Enc Vitals Group     BP 05/15/22 1117 (!) 155/99     Pulse Rate 05/15/22 1117 74  Resp 05/15/22 1117 20     Temp 05/15/22 1117 97.9 F (36.6 C)     Temp src --      SpO2 05/15/22 1117 100 %     Weight --      Height --      Head Circumference --      Peak Flow --      Pain Score 05/15/22 1114 10     Pain Loc --      Pain Edu? --      Excl. in [place]? --    No data found.  Updated Vital Signs BP (!) 155/99   Pulse 74   Temp 97.9 F (36.6 C)   Resp 20   SpO2 100%   Visual Acuity Right Eye Distance:   Left Eye Distance:   Bilateral Distance:    Right Eye Near:   Left Eye Near:    Bilateral Near:     Physical Exam Constitutional:      Appearance: Normal appearance.  HENT:     Left Ear: Tympanic membrane, ear canal and external ear normal.     Ears:     Comments: Severe  swelling to the right ear canal, unable to use autoscope, unable to visualize the tympanic membrane Eyes:     Extraocular Movements: Extraocular movements intact.  Pulmonary:     Effort: Pulmonary effort is normal.  Neurological:     Mental Status: He is alert and oriented to person, place, and time. Mental status is at baseline.      UC Treatments / Results  Labs (all labs ordered are listed, but only abnormal results are displayed) Labs Reviewed - No data to display  EKG   Radiology No results found.  Procedures Procedures (including critical care time)  Medications Ordered in UC Medications - No data to display  Initial Impression / Assessment and Plan / UC Course  I have reviewed the triage vital signs and the nursing notes.  Pertinent labs & imaging results that were available during my care of the patient were reviewed by me and considered in my medical decision making (see chart for details).  Infective otitis externa of right ear  Due to significant swelling of the canal unable to fully assess the ear, discussed this with patient, Decadron injection given in office for management of swelling and pain, prescribed Ciprodex for outpatient and discussed administration, additionally prescribed amoxicillin to provide coverage for the tympanic membrane, recommended consistent use of over-the-counter analgesics and given strict precautions for any persisting or worsening symptoms to follow-up for reevaluation Final Clinical Impressions(s) / UC Diagnoses   Final diagnoses:  None   Discharge Instructions   None    ED Prescriptions   None    PDMP not reviewed this encounter.   Hans Eden, NP 05/15/22 1215

## 2022-05-15 NOTE — ED Triage Notes (Signed)
Internal and external ear pain x 3 days with diff hearing

## 2022-06-12 ENCOUNTER — Ambulatory Visit: Payer: BC Managed Care – PPO | Admitting: Nurse Practitioner

## 2022-06-12 ENCOUNTER — Encounter: Payer: Self-pay | Admitting: Nurse Practitioner

## 2022-06-12 VITALS — BP 121/79 | HR 64 | Temp 97.6°F | Ht 73.27 in | Wt 393.4 lb

## 2022-06-12 DIAGNOSIS — I152 Hypertension secondary to endocrine disorders: Secondary | ICD-10-CM | POA: Diagnosis not present

## 2022-06-12 DIAGNOSIS — E1159 Type 2 diabetes mellitus with other circulatory complications: Secondary | ICD-10-CM | POA: Diagnosis not present

## 2022-06-12 DIAGNOSIS — E669 Obesity, unspecified: Secondary | ICD-10-CM

## 2022-06-12 DIAGNOSIS — E1169 Type 2 diabetes mellitus with other specified complication: Secondary | ICD-10-CM

## 2022-06-12 DIAGNOSIS — E785 Hyperlipidemia, unspecified: Secondary | ICD-10-CM | POA: Diagnosis not present

## 2022-06-12 DIAGNOSIS — Z6841 Body Mass Index (BMI) 40.0 and over, adult: Secondary | ICD-10-CM

## 2022-06-12 LAB — BAYER DCA HB A1C WAIVED: HB A1C (BAYER DCA - WAIVED): 6 % — ABNORMAL HIGH (ref 4.8–5.6)

## 2022-06-12 LAB — MICROALBUMIN, URINE WAIVED
Creatinine, Urine Waived: 50 mg/dL (ref 10–300)
Microalb, Ur Waived: 30 mg/L — ABNORMAL HIGH (ref 0–19)

## 2022-06-12 MED ORDER — OZEMPIC (0.25 OR 0.5 MG/DOSE) 2 MG/3ML ~~LOC~~ SOPN: 0.5000 mg | PEN_INJECTOR | SUBCUTANEOUS | 4 refills | Status: DC

## 2022-06-12 MED ORDER — LISINOPRIL 10 MG PO TABS: 10.0000 mg | ORAL_TABLET | Freq: Every day | ORAL | 4 refills | Status: DC

## 2022-06-12 NOTE — Patient Instructions (Signed)
Diabetes Mellitus Basics  Diabetes mellitus, or diabetes, is a long-term (chronic) disease. It occurs when the body does not properly use sugar (glucose) that is released from food after you eat. Diabetes mellitus may be caused by one or both of these problems: Your pancreas does not make enough of a hormone called insulin. Your body does not react in a normal way to the insulin that it makes. Insulin lets glucose enter cells in your body. This gives you energy. If you have diabetes, glucose cannot get into cells. This causes high blood glucose (hyperglycemia). How to treat and manage diabetes You may need to take insulin or other diabetes medicines daily to keep your glucose in balance. If you are prescribed insulin, you will learn how to give yourself insulin by injection. You may need to adjust the amount of insulin you take based on the foods that you eat. You will need to check your blood glucose levels using a glucose monitor as told by your health care provider. The readings can help determine if you have low or high blood glucose. Generally, you should have these blood glucose levels: Before meals (preprandial): 80-130 mg/dL (4.4-7.2 mmol/L). After meals (postprandial): below 180 mg/dL (10 mmol/L). Hemoglobin A1c (HbA1c) level: less than 7%. Your health care provider will set treatment goals for you. Keep all follow-up visits. This is important. Follow these instructions at home: Diabetes medicines Take your diabetes medicines every day as told by your health care provider. List your diabetes medicines here: Name of medicine: ______________________________ Amount (dose): _______________ Time (a.m./p.m.): _______________ Notes: ___________________________________ Name of medicine: ______________________________ Amount (dose): _______________ Time (a.m./p.m.): _______________ Notes: ___________________________________ Name of medicine: ______________________________ Amount (dose):  _______________ Time (a.m./p.m.): _______________ Notes: ___________________________________ Insulin If you use insulin, list the types of insulin you use here: Insulin type: ______________________________ Amount (dose): _______________ Time (a.m./p.m.): _______________Notes: ___________________________________ Insulin type: ______________________________ Amount (dose): _______________ Time (a.m./p.m.): _______________ Notes: ___________________________________ Insulin type: ______________________________ Amount (dose): _______________ Time (a.m./p.m.): _______________ Notes: ___________________________________ Insulin type: ______________________________ Amount (dose): _______________ Time (a.m./p.m.): _______________ Notes: ___________________________________ Insulin type: ______________________________ Amount (dose): _______________ Time (a.m./p.m.): _______________ Notes: ___________________________________ Managing blood glucose  Check your blood glucose levels using a glucose monitor as told by your health care provider. Write down the times that you check your glucose levels here: Time: _______________ Notes: ___________________________________ Time: _______________ Notes: ___________________________________ Time: _______________ Notes: ___________________________________ Time: _______________ Notes: ___________________________________ Time: _______________ Notes: ___________________________________ Time: _______________ Notes: ___________________________________  Low blood glucose Low blood glucose (hypoglycemia) is when glucose is at or below 70 mg/dL (3.9 mmol/L). Symptoms may include: Feeling: Hungry. Sweaty and clammy. Irritable or easily upset. Dizzy. Sleepy. Having: A fast heartbeat. A headache. A change in your vision. Numbness around the mouth, lips, or tongue. Having trouble with: Moving (coordination). Sleeping. Treating low blood glucose To treat low blood  glucose, eat or drink something containing sugar right away. If you can think clearly and swallow safely, follow the 15:15 rule: Take 15 grams of a fast-acting carb (carbohydrate), as told by your health care provider. Some fast-acting carbs are: Glucose tablets: take 3-4 tablets. Hard candy: eat 3-5 pieces. Fruit juice: drink 4 oz (120 mL). Regular (not diet) soda: drink 4-6 oz (120-180 mL). Honey or sugar: eat 1 Tbsp (15 mL). Check your blood glucose levels 15 minutes after you take the carb. If your glucose is still at or below 70 mg/dL (3.9 mmol/L), take 15 grams of a carb again. If your glucose does not go above 70 mg/dL (3.9 mmol/L) after   3 tries, get help right away. After your glucose goes back to normal, eat a meal or a snack within 1 hour. Treating very low blood glucose If your glucose is at or below 54 mg/dL (3 mmol/L), you have very low blood glucose (severe hypoglycemia). This is an emergency. Do not wait to see if the symptoms will go away. Get medical help right away. Call your local emergency services (911 in the [place] Do not drive yourself to the hospital. Questions to ask your health care provider Should I talk with a diabetes educator? What equipment will I need to care for myself at home? What diabetes medicines do I need? When should I take them? How often do I need to check my blood glucose levels? What number can I call if I have questions? When is my follow-up visit? Where can I find a support group for people with diabetes? Where to find more information American Diabetes Association: www.diabetes.org Association of Diabetes Care and Education Specialists: www.diabeteseducator.org Contact a health care provider if: Your blood glucose is at or above 240 mg/dL (13.3 mmol/L) for 2 days in a row. You have been sick or have had a fever for 2 days or more, and you are not getting better. You have any of these problems for more than 6 hours: You cannot eat or  drink. You feel nauseous. You vomit. You have diarrhea. Get help right away if: Your blood glucose is lower than 54 mg/dL (3 mmol/L). You get confused. You have trouble thinking clearly. You have trouble breathing. These symptoms may represent a serious problem that is an emergency. Do not wait to see if the symptoms will go away. Get medical help right away. Call your local emergency services (911 in the [place] Do not drive yourself to the hospital. Summary Diabetes mellitus is a chronic disease that occurs when the body does not properly use sugar (glucose) that is released from food after you eat. Take insulin and diabetes medicines as told. Check your blood glucose every day, as often as told. Keep all follow-up visits. This is important. This information is not intended to replace advice given to you by your health care provider. Make sure you discuss any questions you have with your health care provider. Document Revised: 07/07/2019 Document Reviewed: 07/07/2019 Elsevier Patient Education  2023 Elsevier Inc.  

## 2022-06-12 NOTE — Assessment & Plan Note (Signed)
Chronic, stable.  BP at goal in office.  Continue Lisinopril at 10 MG, maintain it for kidney protection with proteinuria.  Check CBC, CMP and TSH today.  Urine ALB 16 June 2022.  Recommend continued focus on diet and weight loss at home.  Refills sent.

## 2022-06-12 NOTE — Assessment & Plan Note (Signed)
Chronic, ongoing, refuses statin.  Is focused on diet and exercise.  Will recheck lipid panel today and consider statin if elevations.

## 2022-06-12 NOTE — Assessment & Plan Note (Signed)
Chronic, ongoing with A1c 6% today, lost to follow-up for past year.  Urine ALB 16 June 2022.  Initial diagnosis September 2020 with A1c 6.9%. - Has gained 20 pounds, discussed with him options for diabetes.  Will start Ozempic 0.25 MG weekly for 4 weeks and then increase to 0.5 MG dosing if tolerating.  Provided first injection in office today via sample and instructed on how to use.  No family history of thyroid cancer (MTC, MEN 2, thyroid cell tumors) or pancreatitis.    - Is on ACE for kidney protection, continue this.  No current statin, refuses - Needs eye exam, discussed with patient, referral placed.  Foot exam up to date. - Refuses vaccinations - Continue to monitor BS at home occasionally and focus on diet and weight loss.   Return to office in 4 weeks.

## 2022-06-12 NOTE — Assessment & Plan Note (Signed)
Has gained 20 pounds back. Continue focus on diet and exercise.

## 2022-06-12 NOTE — Progress Notes (Signed)
BP 121/79   Pulse 64   Temp 97.6 F (36.4 C) (Oral)   Ht 6' 1.27" (1.861 m)   Wt (!) 393 lb 6.4 oz (178.4 kg)   SpO2 98%   BMI 51.52 kg/m    Subjective:    Patient ID: Brian Thornton, male    DOB: 11-Jun-1980, 42 y.o.   MRN: [id number]  HPI: Brian Thornton is a 42 y.o. male  Chief Complaint  Patient presents with   Weight Gain   Hypertension   Diabetes   DIABETES Last visit April 5.2% -- no medications.  Had worked on weight loss at time.  Got lost to follow-up after this.  Has gained about 20 pounds since last visit.  Can eat a large meal in one sitting. Hypoglycemic episodes:no Polydipsia/polyuria: no -- some polyphagia Visual disturbance: no Chest pain: no Paresthesias: no Glucose Monitoring: no  Accucheck frequency: Not Checking  Fasting glucose:  Post prandial:  Evening:  Before meals: Taking Insulin?: no  Long acting insulin:  Short acting insulin: Blood Pressure Monitoring: not checking Retinal Examination: Not up to Date Foot Exam: Up to Date Pneumovax:  refused Influenza:  refused Aspirin: no   HYPERTENSION  Continues on Lisinopril 10 MG daily. Hypertension status: stable  Satisfied with current treatment? yes Duration of hypertension: chronic BP monitoring frequency:  not checking BP range:  BP medication side effects:  no Medication compliance: good compliance Aspirin: no Recurrent headaches: no Visual changes: no Palpitations: no Dyspnea: no Chest pain: no Lower extremity edema:  occasional  at end of day, after working R>L Dizzy/lightheaded: no  The 10-year ASCVD risk score (Arnett DK, et al., 2019) is: 3.2%   Values used to calculate the score:     Age: 38 years     Sex: Male     Is Non-Hispanic African American: No     Diabetic: Yes     Tobacco smoker: No     Systolic Blood Pressure: 123XX123 mmHg     Is BP treated: Yes     HDL Cholesterol: 34 mg/dL     Total Cholesterol: 180 mg/dL   Relevant past medical, surgical, family and  social history reviewed and updated as indicated. Interim medical history since our last visit reviewed. Allergies and medications reviewed and updated.  Review of Systems  Constitutional:  Negative for activity change, diaphoresis, fatigue and fever.  Respiratory:  Negative for cough, chest tightness, shortness of breath and wheezing.   Cardiovascular:  Negative for chest pain, palpitations and leg swelling.  Gastrointestinal: Negative.   Endocrine: Negative for cold intolerance, heat intolerance, polydipsia, polyphagia and polyuria.  Neurological: Negative.   Psychiatric/Behavioral: Negative.     Per HPI unless specifically indicated above     Objective:    BP 121/79   Pulse 64   Temp 97.6 F (36.4 C) (Oral)   Ht 6' 1.27" (1.861 m)   Wt (!) 393 lb 6.4 oz (178.4 kg)   SpO2 98%   BMI 51.52 kg/m   Wt Readings from Last 3 Encounters:  06/12/22 (!) 393 lb 6.4 oz (178.4 kg)  09/13/21 (!) 374 lb (169.6 kg)  08/04/21 (!) 378 lb 9.6 oz (171.7 kg)    Physical Exam Vitals and nursing note reviewed.  Constitutional:      General: He is awake. He is not in acute distress.    Appearance: He is well-developed. He is morbidly obese. He is not ill-appearing.  HENT:     Head: Normocephalic and  atraumatic.     Right Ear: Hearing normal. No drainage.     Left Ear: Hearing normal. No drainage.  Eyes:     General: Lids are normal.        Right eye: No discharge.        Left eye: No discharge.     Conjunctiva/sclera: Conjunctivae normal.     Pupils: Pupils are equal, round, and reactive to light.  Neck:     Vascular: No carotid bruit.  Cardiovascular:     Rate and Rhythm: Normal rate and regular rhythm.     Heart sounds: Normal heart sounds, S1 normal and S2 normal. No murmur heard.    No gallop.  Pulmonary:     Effort: Pulmonary effort is normal. No accessory muscle usage or respiratory distress.     Breath sounds: Normal breath sounds.  Abdominal:     General: Bowel sounds are  normal.     Palpations: Abdomen is soft.  Musculoskeletal:        General: Normal range of motion.     Cervical back: Normal range of motion and neck supple.     Right lower leg: No edema.     Left lower leg: No edema.  Skin:    General: Skin is warm and dry.  Neurological:     Mental Status: He is alert and oriented to person, place, and time.  Psychiatric:        Mood and Affect: Mood normal.        Behavior: Behavior normal. Behavior is cooperative.        Thought Content: Thought content normal.        Judgment: Judgment normal.     Results for orders placed or performed in visit on 08/04/21  Bayer DCA Hb A1c Waived  Result Value Ref Range   HB A1C (BAYER DCA - WAIVED) 5.2 4.8 - 5.6 %  Microalbumin, Urine Waived  Result Value Ref Range   Microalb, Ur Waived 80 (H) 0 - 19 mg/L   Creatinine, Urine Waived 200 10 - 300 mg/dL   Microalb/Creat Ratio 30-300 (H) <30 mg/g  Comprehensive metabolic panel  Result Value Ref Range   Glucose 94 70 - 99 mg/dL   BUN 17 6 - 20 mg/dL   Creatinine, Ser 1.05 0.76 - 1.27 mg/dL   eGFR 93 >59 mL/min/1.73   BUN/Creatinine Ratio 16 9 - 20   Sodium 141 134 - 144 mmol/L   Potassium 4.1 3.5 - 5.2 mmol/L   Chloride 103 96 - 106 mmol/L   CO2 23 20 - 29 mmol/L   Calcium 9.4 8.7 - 10.2 mg/dL   Total Protein 7.1 6.0 - 8.5 g/dL   Albumin 4.5 4.0 - 5.0 g/dL   Globulin, Total 2.6 1.5 - 4.5 g/dL   Albumin/Globulin Ratio 1.7 1.2 - 2.2   Bilirubin Total 0.4 0.0 - 1.2 mg/dL   Alkaline Phosphatase 78 44 - 121 IU/L   AST 21 0 - 40 IU/L   ALT 34 0 - 44 IU/L  CBC with Differential/Platelet  Result Value Ref Range   WBC 6.0 3.4 - 10.8 x10E3/uL   RBC 6.01 (H) 4.14 - 5.80 x10E6/uL   Hemoglobin 17.2 13.0 - 17.7 g/dL   Hematocrit 51.1 (H) 37.5 - 51.0 %   MCV 85 79 - 97 fL   MCH 28.6 26.6 - 33.0 pg   MCHC 33.7 31.5 - 35.7 g/dL   RDW 14.1 11.6 - 15.4 %   Platelets 179  150 - 450 x10E3/uL   Neutrophils 54 Not Estab. %   Lymphs 32 Not Estab. %   Monocytes  8 Not Estab. %   Eos 4 Not Estab. %   Basos 1 Not Estab. %   Neutrophils Absolute 3.3 1.4 - 7.0 x10E3/uL   Lymphocytes Absolute 1.9 0.7 - 3.1 x10E3/uL   Monocytes Absolute 0.5 0.1 - 0.9 x10E3/uL   EOS (ABSOLUTE) 0.3 0.0 - 0.4 x10E3/uL   Basophils Absolute 0.0 0.0 - 0.2 x10E3/uL   Immature Granulocytes 1 Not Estab. %   Immature Grans (Abs) 0.1 0.0 - 0.1 x10E3/uL  Lipid Panel w/o Chol/HDL Ratio  Result Value Ref Range   Cholesterol, Total 180 100 - 199 mg/dL   Triglycerides 144 0 - 149 mg/dL   HDL 34 (L) >39 mg/dL   VLDL Cholesterol Cal 26 5 - 40 mg/dL   LDL Chol Calc (NIH) 120 (H) 0 - 99 mg/dL  TSH  Result Value Ref Range   TSH 1.610 0.450 - 4.500 uIU/mL      Assessment & Plan:   Problem List Items Addressed This Visit       Cardiovascular and Mediastinum   Hypertension associated with diabetes (HCC)    Chronic, stable.  BP at goal in office.  Continue Lisinopril at 10 MG, maintain it for kidney protection with proteinuria.  Check CBC, CMP and TSH today.  Urine ALB 16 June 2022.  Recommend continued focus on diet and weight loss at home.  Refills sent.      Relevant Medications   Semaglutide,0.25 or 0.5MG /DOS, (OZEMPIC, 0.25 OR 0.5 MG/DOSE,) 2 MG/3ML SOPN   lisinopril (ZESTRIL) 10 MG tablet   Other Relevant Orders   Bayer DCA Hb A1c Waived   Microalbumin, Urine Waived   Comprehensive metabolic panel   TSH     Endocrine   Hyperlipidemia associated with type 2 diabetes mellitus (HCC)    Chronic, ongoing, refuses statin.  Is focused on diet and exercise.  Will recheck lipid panel today and consider statin if elevations.      Relevant Medications   Semaglutide,0.25 or 0.5MG /DOS, (OZEMPIC, 0.25 OR 0.5 MG/DOSE,) 2 MG/3ML SOPN   lisinopril (ZESTRIL) 10 MG tablet   Other Relevant Orders   Bayer DCA Hb A1c Waived   Comprehensive metabolic panel   Lipid Panel w/o Chol/HDL Ratio   Type 2 diabetes mellitus with obesity (HCC) - Primary    Chronic, ongoing with A1c 6% today,  lost to follow-up for past year.  Urine ALB 16 June 2022.  Initial diagnosis September 2020 with A1c 6.9%. - Has gained 20 pounds, discussed with him options for diabetes.  Will start Ozempic 0.25 MG weekly for 4 weeks and then increase to 0.5 MG dosing if tolerating.  Provided first injection in office today via sample and instructed on how to use.  No family history of thyroid cancer (MTC, MEN 2, thyroid cell tumors) or pancreatitis.    - Is on ACE for kidney protection, continue this.  No current statin, refuses - Needs eye exam, discussed with patient, referral placed.  Foot exam up to date. - Refuses vaccinations - Continue to monitor BS at home occasionally and focus on diet and weight loss.   Return to office in 4 weeks.      Relevant Medications   Semaglutide,0.25 or 0.5MG /DOS, (OZEMPIC, 0.25 OR 0.5 MG/DOSE,) 2 MG/3ML SOPN   lisinopril (ZESTRIL) 10 MG tablet   Other Relevant Orders   Bayer DCA Hb A1c Waived  Microalbumin, Urine Waived   Comprehensive metabolic panel   Ambulatory referral to Ophthalmology     Other   BMI 50.0-59.9, adult ([place])    Has gained 20 pounds back. Continue focus on diet and exercise.      Relevant Medications   Semaglutide,0.25 or 0.5MG /DOS, (OZEMPIC, 0.25 OR 0.5 MG/DOSE,) 2 MG/3ML SOPN   Morbid obesity (HCC)    BMI 51.52 with T2DM and HTN.  Recommended eating smaller high protein, low fat meals more frequently and exercising 30 mins a day 5 times a week with a goal of 10-15lb weight loss in the next 3 months. Patient voiced their understanding and motivation to adhere to these recommendations.  Start Ozempic, refer to diabetes plan.       Relevant Medications   Semaglutide,0.25 or 0.5MG /DOS, (OZEMPIC, 0.25 OR 0.5 MG/DOSE,) 2 MG/3ML SOPN     Follow up plan: Return in about 4 weeks (around 07/10/2022) for T2DM -- started Ozempic.

## 2022-06-12 NOTE — Assessment & Plan Note (Signed)
BMI 51.52 with T2DM and HTN.  Recommended eating smaller high protein, low fat meals more frequently and exercising 30 mins a day 5 times a week with a goal of 10-15lb weight loss in the next 3 months. Patient voiced their understanding and motivation to adhere to these recommendations.  Start Ozempic, refer to diabetes plan.

## 2022-06-13 LAB — LIPID PANEL W/O CHOL/HDL RATIO
Cholesterol, Total: 174 mg/dL (ref 100–199)
HDL: 32 mg/dL — ABNORMAL LOW (ref >39)
LDL Chol Calc (NIH): 113 mg/dL — ABNORMAL HIGH (ref 0–99)
Triglycerides: 164 mg/dL — ABNORMAL HIGH (ref 0–149)
VLDL Cholesterol Cal: 29 mg/dL (ref 5–40)

## 2022-06-13 LAB — COMPREHENSIVE METABOLIC PANEL
ALT: 39 IU/L (ref 0–44)
AST: 24 IU/L (ref 0–40)
Albumin/Globulin Ratio: 1.8 (ref 1.2–2.2)
Albumin: 4.4 g/dL (ref 4.1–5.1)
Alkaline Phosphatase: 89 IU/L (ref 44–121)
BUN/Creatinine Ratio: 12 (ref 9–20)
BUN: 12 mg/dL (ref 6–24)
Bilirubin Total: 0.5 mg/dL (ref 0.0–1.2)
CO2: 22 mmol/L (ref 20–29)
Calcium: 9.4 mg/dL (ref 8.7–10.2)
Chloride: 99 mmol/L (ref 96–106)
Creatinine, Ser: 1.04 mg/dL (ref 0.76–1.27)
Globulin, Total: 2.5 g/dL (ref 1.5–4.5)
Glucose: 95 mg/dL (ref 70–99)
Potassium: 3.9 mmol/L (ref 3.5–5.2)
Sodium: 138 mmol/L (ref 134–144)
Total Protein: 6.9 g/dL (ref 6.0–8.5)
eGFR: 93 mL/min/{1.73_m2} (ref >59)

## 2022-06-13 LAB — TSH: TSH: 0.913 u[IU]/mL (ref 0.450–4.500)

## 2022-06-13 NOTE — Progress Notes (Signed)
Contacted via [place] evening Kyce, your labs have returned: - Kidney function, creatinine and eGFR, remains normal, as is liver function, AST and ALT.  - Thyroid lab, TSH, is normal. - Cholesterol labs are elevated, with diabetes we monitor this closely.  It is recommended all diabetics be on statin therapy.  For now focus heavily on diet and regular activity.  Any questions? Keep being amazing!!  Thank you for allowing me to participate in your care.  I appreciate you. Kindest regards, Logann Whitebread

## 2022-07-08 NOTE — Patient Instructions (Signed)
Be Involved in Your Health Care:  Taking Medications When medications are taken as directed, they can greatly improve your health. But if they are not taken as instructed, they may not work. In some cases, not taking them correctly can be harmful. To help ensure your treatment remains effective and safe, understand your medications and how to take them.  Your lab results, notes and after visit summary will be available on My Chart. We strongly encourage you to use this feature. If lab results are abnormal the clinic will contact you with the appropriate steps. If the clinic does not contact you assume the results are satisfactory. You can always see your results on My Chart. If you have questions regarding your condition, please contact the clinic during office hours. You can also ask questions on My Chart.  We at [facility] are grateful that you chose us to provide care. We strive to provide excellent and compassionate care and are always looking for feedback. If you get a survey from the clinic please complete this.   Diabetes Mellitus Basics  Diabetes mellitus, or diabetes, is a long-term (chronic) disease. It occurs when the body does not properly use sugar (glucose) that is released from food after you eat. Diabetes mellitus may be caused by one or both of these problems: Your pancreas does not make enough of a hormone called insulin. Your body does not react in a normal way to the insulin that it makes. Insulin lets glucose enter cells in your body. This gives you energy. If you have diabetes, glucose cannot get into cells. This causes high blood glucose (hyperglycemia). How to treat and manage diabetes You may need to take insulin or other diabetes medicines daily to keep your glucose in balance. If you are prescribed insulin, you will learn how to give yourself insulin by injection. You may need to adjust the amount of insulin you take based on the foods that you eat. You will  need to check your blood glucose levels using a glucose monitor as told by your health care provider. The readings can help determine if you have low or high blood glucose. Generally, you should have these blood glucose levels: Before meals (preprandial): 80-130 mg/dL (4.4-7.2 mmol/L). After meals (postprandial): below 180 mg/dL (10 mmol/L). Hemoglobin A1c (HbA1c) level: less than 7%. Your health care provider will set treatment goals for you. Keep all follow-up visits. This is important. Follow these instructions at home: Diabetes medicines Take your diabetes medicines every day as told by your health care provider. List your diabetes medicines here: Name of medicine: ______________________________ Amount (dose): _______________ Time (a.m./p.m.): _______________ Notes: ___________________________________ Name of medicine: ______________________________ Amount (dose): _______________ Time (a.m./p.m.): _______________ Notes: ___________________________________ Name of medicine: ______________________________ Amount (dose): _______________ Time (a.m./p.m.): _______________ Notes: ___________________________________ Insulin If you use insulin, list the types of insulin you use here: Insulin type: ______________________________ Amount (dose): _______________ Time (a.m./p.m.): _______________Notes: ___________________________________ Insulin type: ______________________________ Amount (dose): _______________ Time (a.m./p.m.): _______________ Notes: ___________________________________ Insulin type: ______________________________ Amount (dose): _______________ Time (a.m./p.m.): _______________ Notes: ___________________________________ Insulin type: ______________________________ Amount (dose): _______________ Time (a.m./p.m.): _______________ Notes: ___________________________________ Insulin type: ______________________________ Amount (dose): _______________ Time (a.m./p.m.): _______________  Notes: ___________________________________ Managing blood glucose  Check your blood glucose levels using a glucose monitor as told by your health care provider. Write down the times that you check your glucose levels here: Time: _______________ Notes: ___________________________________ Time: _______________ Notes: ___________________________________ Time: _______________ Notes: ___________________________________ Time: _______________ Notes: ___________________________________ Time: _______________ Notes: ___________________________________ Time: _______________ Notes: ___________________________________    Low blood glucose Low blood glucose (hypoglycemia) is when glucose is at or below 70 mg/dL (3.9 mmol/L). Symptoms may include: Feeling: Hungry. Sweaty and clammy. Irritable or easily upset. Dizzy. Sleepy. Having: A fast heartbeat. A headache. A change in your vision. Numbness around the mouth, lips, or tongue. Having trouble with: Moving (coordination). Sleeping. Treating low blood glucose To treat low blood glucose, eat or drink something containing sugar right away. If you can think clearly and swallow safely, follow the 15:15 rule: Take 15 grams of a fast-acting carb (carbohydrate), as told by your health care provider. Some fast-acting carbs are: Glucose tablets: take 3-4 tablets. Hard candy: eat 3-5 pieces. Fruit juice: drink 4 oz (120 mL). Regular (not diet) soda: drink 4-6 oz (120-180 mL). Honey or sugar: eat 1 Tbsp (15 mL). Check your blood glucose levels 15 minutes after you take the carb. If your glucose is still at or below 70 mg/dL (3.9 mmol/L), take 15 grams of a carb again. If your glucose does not go above 70 mg/dL (3.9 mmol/L) after 3 tries, get help right away. After your glucose goes back to normal, eat a meal or a snack within 1 hour. Treating very low blood glucose If your glucose is at or below 54 mg/dL (3 mmol/L), you have very low blood glucose  (severe hypoglycemia). This is an emergency. Do not wait to see if the symptoms will go away. Get medical help right away. Call your local emergency services (911 in the [place] Do not drive yourself to the hospital. Questions to ask your health care provider Should I talk with a diabetes educator? What equipment will I need to care for myself at home? What diabetes medicines do I need? When should I take them? How often do I need to check my blood glucose levels? What number can I call if I have questions? When is my follow-up visit? Where can I find a support group for people with diabetes? Where to find more information American Diabetes Association: www.diabetes.org Association of Diabetes Care and Education Specialists: www.diabeteseducator.org Contact a health care provider if: Your blood glucose is at or above 240 mg/dL (13.3 mmol/L) for 2 days in a row. You have been sick or have had a fever for 2 days or more, and you are not getting better. You have any of these problems for more than 6 hours: You cannot eat or drink. You feel nauseous. You vomit. You have diarrhea. Get help right away if: Your blood glucose is lower than 54 mg/dL (3 mmol/L). You get confused. You have trouble thinking clearly. You have trouble breathing. These symptoms may represent a serious problem that is an emergency. Do not wait to see if the symptoms will go away. Get medical help right away. Call your local emergency services (911 in the [place] Do not drive yourself to the hospital. Summary Diabetes mellitus is a chronic disease that occurs when the body does not properly use sugar (glucose) that is released from food after you eat. Take insulin and diabetes medicines as told. Check your blood glucose every day, as often as told. Keep all follow-up visits. This is important. This information is not intended to replace advice given to you by your health care provider. Make sure you discuss any  questions you have with your health care provider. Document Revised: 07/07/2019 Document Reviewed: 07/07/2019 Elsevier Patient Education  2023 Elsevier Inc.  

## 2022-07-10 ENCOUNTER — Encounter: Payer: Self-pay | Admitting: Nurse Practitioner

## 2022-07-10 ENCOUNTER — Ambulatory Visit: Payer: BC Managed Care – PPO | Admitting: Nurse Practitioner

## 2022-07-10 VITALS — BP 135/86 | HR 66 | Temp 97.5°F | Ht 73.39 in | Wt 395.4 lb

## 2022-07-10 DIAGNOSIS — E1169 Type 2 diabetes mellitus with other specified complication: Secondary | ICD-10-CM

## 2022-07-10 DIAGNOSIS — E669 Obesity, unspecified: Secondary | ICD-10-CM

## 2022-07-10 NOTE — Progress Notes (Signed)
BP 135/86   Pulse 66   Temp (!) 97.5 F (36.4 C) (Oral)   Ht 6' 1.39" (1.864 m)   Wt (!) 395 lb 6.4 oz (179.4 kg)   SpO2 98%   BMI 51.62 kg/m    Subjective:    Patient ID: Brian Thornton, male    DOB: 12-17-1980, 42 y.o.   MRN: [id number]  HPI: Brian Thornton is a 42 y.o. male  Chief Complaint  Patient presents with   Diabetes    Started Ozempic as last visit, patient states he is not having any ill side effects at this time   DIABETES Last A1c 6% March 2024.  Started Ozempic at visit, is tolerating this.  Is noticing some reduction in appetite, can not finish a normal plate.  Has completed the 0.25 MG dosing for 4 weeks.  Denies any N&V, diarrhea, or constipation.  A little dry mouth. Hypoglycemic episodes:no Polydipsia/polyuria: no Visual disturbance: no Chest pain: no Paresthesias: no Glucose Monitoring: no  Accucheck frequency: Not Checking  Fasting glucose:  Post prandial:  Evening:  Before meals: Taking Insulin?: no  Long acting insulin:  Short acting insulin: Blood Pressure Monitoring: not checking Retinal Examination: Not up to Date Foot Exam: Up to Date Pneumovax: refuses Influenza: refuses Aspirin: no   Relevant past medical, surgical, family and social history reviewed and updated as indicated. Interim medical history since our last visit reviewed. Allergies and medications reviewed and updated.  Review of Systems  Constitutional:  Negative for activity change, diaphoresis, fatigue and fever.  Respiratory:  Negative for cough, chest tightness, shortness of breath and wheezing.   Cardiovascular:  Negative for chest pain, palpitations and leg swelling.  Gastrointestinal: Negative.   Endocrine: Negative for cold intolerance, heat intolerance, polydipsia, polyphagia and polyuria.  Neurological: Negative.   Psychiatric/Behavioral: Negative.      Per HPI unless specifically indicated above     Objective:    BP 135/86   Pulse 66   Temp (!)  97.5 F (36.4 C) (Oral)   Ht 6' 1.39" (1.864 m)   Wt (!) 395 lb 6.4 oz (179.4 kg)   SpO2 98%   BMI 51.62 kg/m   Wt Readings from Last 3 Encounters:  07/10/22 (!) 395 lb 6.4 oz (179.4 kg)  06/12/22 (!) 393 lb 6.4 oz (178.4 kg)  09/13/21 (!) 374 lb (169.6 kg)    Physical Exam Vitals and nursing note reviewed.  Constitutional:      General: He is awake. He is not in acute distress.    Appearance: He is well-developed. He is morbidly obese. He is not ill-appearing.  HENT:     Head: Normocephalic and atraumatic.     Right Ear: Hearing normal. No drainage.     Left Ear: Hearing normal. No drainage.  Eyes:     General: Lids are normal.        Right eye: No discharge.        Left eye: No discharge.     Conjunctiva/sclera: Conjunctivae normal.     Pupils: Pupils are equal, round, and reactive to light.  Neck:     Vascular: No carotid bruit.  Cardiovascular:     Rate and Rhythm: Normal rate and regular rhythm.     Heart sounds: Normal heart sounds, S1 normal and S2 normal. No murmur heard.    No gallop.  Pulmonary:     Effort: Pulmonary effort is normal. No accessory muscle usage or respiratory distress.  Breath sounds: Normal breath sounds.  Abdominal:     General: Bowel sounds are normal.     Palpations: Abdomen is soft.  Musculoskeletal:        General: Normal range of motion.     Cervical back: Normal range of motion and neck supple.     Right lower leg: No edema.     Left lower leg: No edema.  Skin:    General: Skin is warm and dry.  Neurological:     Mental Status: He is alert and oriented to person, place, and time.  Psychiatric:        Mood and Affect: Mood normal.        Behavior: Behavior normal. Behavior is cooperative.        Thought Content: Thought content normal.        Judgment: Judgment normal.    Results for orders placed or performed in visit on 06/12/22  Bayer DCA Hb A1c Waived  Result Value Ref Range   HB A1C (BAYER DCA - WAIVED) 6.0 (H) 4.8 -  5.6 %  Microalbumin, Urine Waived  Result Value Ref Range   Microalb, Ur Waived 30 (H) 0 - 19 mg/L   Creatinine, Urine Waived 50 10 - 300 mg/dL   Microalb/Creat Ratio 30-300 (H) <30 mg/g  Comprehensive metabolic panel  Result Value Ref Range   Glucose 95 70 - 99 mg/dL   BUN 12 6 - 24 mg/dL   Creatinine, Ser 5.78 0.76 - 1.27 mg/dL   eGFR 93 >46 NG/EXB/2.84   BUN/Creatinine Ratio 12 9 - 20   Sodium 138 134 - 144 mmol/L   Potassium 3.9 3.5 - 5.2 mmol/L   Chloride 99 96 - 106 mmol/L   CO2 22 20 - 29 mmol/L   Calcium 9.4 8.7 - 10.2 mg/dL   Total Protein 6.9 6.0 - 8.5 g/dL   Albumin 4.4 4.1 - 5.1 g/dL   Globulin, Total 2.5 1.5 - 4.5 g/dL   Albumin/Globulin Ratio 1.8 1.2 - 2.2   Bilirubin Total 0.5 0.0 - 1.2 mg/dL   Alkaline Phosphatase 89 44 - 121 IU/L   AST 24 0 - 40 IU/L   ALT 39 0 - 44 IU/L  Lipid Panel w/o Chol/HDL Ratio  Result Value Ref Range   Cholesterol, Total 174 100 - 199 mg/dL   Triglycerides 132 (H) 0 - 149 mg/dL   HDL 32 (L) >44 mg/dL   VLDL Cholesterol Cal 29 5 - 40 mg/dL   LDL Chol Calc (NIH) 010 (H) 0 - 99 mg/dL  TSH  Result Value Ref Range   TSH 0.913 0.450 - 4.500 uIU/mL      Assessment & Plan:   Problem List Items Addressed This Visit       Endocrine   Type 2 diabetes mellitus with obesity - Primary    Chronic, ongoing with A1c 6% March 2024.  Urine ALB 16 June 2022.  Initial diagnosis September 2020 with A1c 6.9%. - Tolerating Ozempic, recommend he go up to 0.5 MG dosing which he is due to do.  Plan on recheck A1c in June.  - Is on ACE for kidney protection, continue this.  No current statin, refuses - Needs eye exam, discussed with patient, referral placed last visit will check on this.  Foot exam up to date. - Refuses vaccinations - Continue to monitor BS at home occasionally and focus on diet and weight loss.   Return to office in June for A1c check.  Follow up plan: Return in about 2 months (around 09/13/2022) for T2DM,  HTN/HLD.

## 2022-07-10 NOTE — Assessment & Plan Note (Signed)
Chronic, ongoing with A1c 6% March 2024.  Urine ALB 16 June 2022.  Initial diagnosis September 2020 with A1c 6.9%. - Tolerating Ozempic, recommend he go up to 0.5 MG dosing which he is due to do.  Plan on recheck A1c in June.  - Is on ACE for kidney protection, continue this.  No current statin, refuses - Needs eye exam, discussed with patient, referral placed last visit will check on this.  Foot exam up to date. - Refuses vaccinations - Continue to monitor BS at home occasionally and focus on diet and weight loss.   Return to office in June for A1c check.

## 2022-07-13 NOTE — Telephone Encounter (Signed)
-----   Message from Clemmie Krill, [place] sent at 07/13/2022 12:46 PM EDT ----- It looks like Sarah sent it to [facility] in [place]. He can call to check the status of his referral at [phone].   ----- Message ----- From: Sherolyn Buba, CMA Sent: 07/13/2022  11:43 AM EDT To: Clemmie Krill, CMA   ----- Message ----- From: Marjie Skiff, NP Sent: 07/10/2022   5:06 PM EDT To: Sherolyn Buba, CMA  Can we check on eye doctor referral placed March 2024, he has not heard anything.  Maybe Debbe Odea can help [place]:)

## 2022-07-13 NOTE — Telephone Encounter (Signed)
Called patient and made him aware of where the referral was sent and a phone number that he can call. Patient verbalized understanding

## 2022-07-23 ENCOUNTER — Other Ambulatory Visit: Payer: Self-pay | Admitting: Nurse Practitioner

## 2022-09-03 ENCOUNTER — Telehealth: Payer: Self-pay | Admitting: Nurse Practitioner

## 2022-09-03 NOTE — Telephone Encounter (Signed)
Copied from CRM [id number]. Topic: General - Other >> Sep 03, 2022 11:34 AM Macon Large wrote: Reason for CRM: Pt reports that there was an argument at his home and his Ozempic was thrown away. Pt stated he contated the pharmacy but he was told that the refill is not available to be refilled until 09/17/22. Pt requests call back to advise what he should do because he will not have the medication this week. Cb# [phone]

## 2022-09-03 NOTE — Telephone Encounter (Signed)
Returned patient's call and informed him of PCP's recommendations, her verbalized understanding and stated that he will wait until he sees his Pcp on Monday.

## 2022-09-09 DIAGNOSIS — Z8739 Personal history of other diseases of the musculoskeletal system and connective tissue: Secondary | ICD-10-CM | POA: Insufficient documentation

## 2022-09-09 NOTE — Patient Instructions (Signed)
Be Involved in Your Health Care:  Taking Medications When medications are taken as directed, they can greatly improve your health. But if they are not taken as instructed, they may not work. In some cases, not taking them correctly can be harmful. To help ensure your treatment remains effective and safe, understand your medications and how to take them.  Your lab results, notes and after visit summary will be available on My Chart. We strongly encourage you to use this feature. If lab results are abnormal the clinic will contact you with the appropriate steps. If the clinic does not contact you assume the results are satisfactory. You can always see your results on My Chart. If you have questions regarding your condition, please contact the clinic during office hours. You can also ask questions on My Chart.  We at [facility] are grateful that you chose us to provide care. We strive to provide excellent and compassionate care and are always looking for feedback. If you get a survey from the clinic please complete this.   Diabetes Mellitus Basics  Diabetes mellitus, or diabetes, is a long-term (chronic) disease. It occurs when the body does not properly use sugar (glucose) that is released from food after you eat. Diabetes mellitus may be caused by one or both of these problems: Your pancreas does not make enough of a hormone called insulin. Your body does not react in a normal way to the insulin that it makes. Insulin lets glucose enter cells in your body. This gives you energy. If you have diabetes, glucose cannot get into cells. This causes high blood glucose (hyperglycemia). How to treat and manage diabetes You may need to take insulin or other diabetes medicines daily to keep your glucose in balance. If you are prescribed insulin, you will learn how to give yourself insulin by injection. You may need to adjust the amount of insulin you take based on the foods that you eat. You will  need to check your blood glucose levels using a glucose monitor as told by your health care provider. The readings can help determine if you have low or high blood glucose. Generally, you should have these blood glucose levels: Before meals (preprandial): 80-130 mg/dL (4.4-7.2 mmol/L). After meals (postprandial): below 180 mg/dL (10 mmol/L). Hemoglobin A1c (HbA1c) level: less than 7%. Your health care provider will set treatment goals for you. Keep all follow-up visits. This is important. Follow these instructions at home: Diabetes medicines Take your diabetes medicines every day as told by your health care provider. List your diabetes medicines here: Name of medicine: ______________________________ Amount (dose): _______________ Time (a.m./p.m.): _______________ Notes: ___________________________________ Name of medicine: ______________________________ Amount (dose): _______________ Time (a.m./p.m.): _______________ Notes: ___________________________________ Name of medicine: ______________________________ Amount (dose): _______________ Time (a.m./p.m.): _______________ Notes: ___________________________________ Insulin If you use insulin, list the types of insulin you use here: Insulin type: ______________________________ Amount (dose): _______________ Time (a.m./p.m.): _______________Notes: ___________________________________ Insulin type: ______________________________ Amount (dose): _______________ Time (a.m./p.m.): _______________ Notes: ___________________________________ Insulin type: ______________________________ Amount (dose): _______________ Time (a.m./p.m.): _______________ Notes: ___________________________________ Insulin type: ______________________________ Amount (dose): _______________ Time (a.m./p.m.): _______________ Notes: ___________________________________ Insulin type: ______________________________ Amount (dose): _______________ Time (a.m./p.m.): _______________  Notes: ___________________________________ Managing blood glucose  Check your blood glucose levels using a glucose monitor as told by your health care provider. Write down the times that you check your glucose levels here: Time: _______________ Notes: ___________________________________ Time: _______________ Notes: ___________________________________ Time: _______________ Notes: ___________________________________ Time: _______________ Notes: ___________________________________ Time: _______________ Notes: ___________________________________ Time: _______________ Notes: ___________________________________    Low blood glucose Low blood glucose (hypoglycemia) is when glucose is at or below 70 mg/dL (3.9 mmol/L). Symptoms may include: Feeling: Hungry. Sweaty and clammy. Irritable or easily upset. Dizzy. Sleepy. Having: A fast heartbeat. A headache. A change in your vision. Numbness around the mouth, lips, or tongue. Having trouble with: Moving (coordination). Sleeping. Treating low blood glucose To treat low blood glucose, eat or drink something containing sugar right away. If you can think clearly and swallow safely, follow the 15:15 rule: Take 15 grams of a fast-acting carb (carbohydrate), as told by your health care provider. Some fast-acting carbs are: Glucose tablets: take 3-4 tablets. Hard candy: eat 3-5 pieces. Fruit juice: drink 4 oz (120 mL). Regular (not diet) soda: drink 4-6 oz (120-180 mL). Honey or sugar: eat 1 Tbsp (15 mL). Check your blood glucose levels 15 minutes after you take the carb. If your glucose is still at or below 70 mg/dL (3.9 mmol/L), take 15 grams of a carb again. If your glucose does not go above 70 mg/dL (3.9 mmol/L) after 3 tries, get help right away. After your glucose goes back to normal, eat a meal or a snack within 1 hour. Treating very low blood glucose If your glucose is at or below 54 mg/dL (3 mmol/L), you have very low blood glucose  (severe hypoglycemia). This is an emergency. Do not wait to see if the symptoms will go away. Get medical help right away. Call your local emergency services (911 in the [place] Do not drive yourself to the hospital. Questions to ask your health care provider Should I talk with a diabetes educator? What equipment will I need to care for myself at home? What diabetes medicines do I need? When should I take them? How often do I need to check my blood glucose levels? What number can I call if I have questions? When is my follow-up visit? Where can I find a support group for people with diabetes? Where to find more information American Diabetes Association: www.diabetes.org Association of Diabetes Care and Education Specialists: www.diabeteseducator.org Contact a health care provider if: Your blood glucose is at or above 240 mg/dL (13.3 mmol/L) for 2 days in a row. You have been sick or have had a fever for 2 days or more, and you are not getting better. You have any of these problems for more than 6 hours: You cannot eat or drink. You feel nauseous. You vomit. You have diarrhea. Get help right away if: Your blood glucose is lower than 54 mg/dL (3 mmol/L). You get confused. You have trouble thinking clearly. You have trouble breathing. These symptoms may represent a serious problem that is an emergency. Do not wait to see if the symptoms will go away. Get medical help right away. Call your local emergency services (911 in the [place] Do not drive yourself to the hospital. Summary Diabetes mellitus is a chronic disease that occurs when the body does not properly use sugar (glucose) that is released from food after you eat. Take insulin and diabetes medicines as told. Check your blood glucose every day, as often as told. Keep all follow-up visits. This is important. This information is not intended to replace advice given to you by your health care provider. Make sure you discuss any  questions you have with your health care provider. Document Revised: 07/07/2019 Document Reviewed: 07/07/2019 Elsevier Patient Education  2024 [employer] Inc.  

## 2022-09-10 ENCOUNTER — Encounter: Payer: Self-pay | Admitting: Nurse Practitioner

## 2022-09-10 ENCOUNTER — Ambulatory Visit: Payer: BC Managed Care – PPO | Admitting: Nurse Practitioner

## 2022-09-10 VITALS — BP 116/75 | HR 73 | Temp 98.2°F | Ht 73.39 in | Wt 356.8 lb

## 2022-09-10 DIAGNOSIS — E785 Hyperlipidemia, unspecified: Secondary | ICD-10-CM

## 2022-09-10 DIAGNOSIS — Z6841 Body Mass Index (BMI) 40.0 and over, adult: Secondary | ICD-10-CM | POA: Diagnosis not present

## 2022-09-10 DIAGNOSIS — E1159 Type 2 diabetes mellitus with other circulatory complications: Secondary | ICD-10-CM | POA: Diagnosis not present

## 2022-09-10 DIAGNOSIS — E1169 Type 2 diabetes mellitus with other specified complication: Secondary | ICD-10-CM

## 2022-09-10 DIAGNOSIS — I152 Hypertension secondary to endocrine disorders: Secondary | ICD-10-CM

## 2022-09-10 DIAGNOSIS — E669 Obesity, unspecified: Secondary | ICD-10-CM

## 2022-09-10 DIAGNOSIS — Z7985 Long-term (current) use of injectable non-insulin antidiabetic drugs: Secondary | ICD-10-CM

## 2022-09-10 LAB — BAYER DCA HB A1C WAIVED: HB A1C (BAYER DCA - WAIVED): 5.3 % (ref 4.8–5.6)

## 2022-09-10 MED ORDER — SEMAGLUTIDE (1 MG/DOSE) 4 MG/3ML ~~LOC~~ SOPN: 1.0000 mg | PEN_INJECTOR | SUBCUTANEOUS | 4 refills | Status: DC

## 2022-09-10 NOTE — Assessment & Plan Note (Signed)
BMI 46.58 with T2DM and HTN, has had 39 lbs loss with Ozempic.  Recommended eating smaller high protein, low fat meals more frequently and exercising 30 mins a day 5 times a week with a goal of 10-15lb weight loss in the next 3 months. Patient voiced their understanding and motivation to adhere to these recommendations.

## 2022-09-10 NOTE — Assessment & Plan Note (Signed)
Chronic, stable.  BP at goal in office.  Continue Lisinopril at 10 MG, maintain it for kidney protection with proteinuria.  Urine ALB 16 June 2022.  Recommend continued focus on diet and weight loss at home.  LABS: CMP.  Refills sent.

## 2022-09-10 NOTE — Progress Notes (Signed)
BP 116/75   Pulse 73   Temp 98.2 F (36.8 C) (Oral)   Ht 6' 1.39" (1.864 m)   Wt (!) 356 lb 12.8 oz (161.8 kg)   SpO2 95%   BMI 46.58 kg/m    Subjective:    Patient ID: Brian Thornton, male    DOB: 21-Jul-1980, 42 y.o.   MRN: [id number]  HPI: Brian Thornton is a 42 y.o. male  Chief Complaint  Patient presents with   Diabetes   Hypertension   Hyperlipidemia   DIABETES A1c March 6%.  Started on Ozempic 0.5 MG weekly and tolerating well with no major side effect.  Has lost 39 pounds since starting Ozempic, offering benefit.  Notices less cravings and reduction in food intake.   Hypoglycemic episodes:no Polydipsia/polyuria: no  Visual disturbance: no Chest pain: no Paresthesias: no Glucose Monitoring: no  Accucheck frequency: Not Checking  Fasting glucose:  Post prandial:  Evening:  Before meals: Taking Insulin?: no  Long acting insulin:  Short acting insulin: Blood Pressure Monitoring: not checking Retinal Examination: Not up to Date Foot Exam: Up to Date Pneumovax:  refused Influenza:  refused Aspirin: no   HYPERTENSION  Continues on Lisinopril 10 MG daily. Hypertension status: stable  Satisfied with current treatment? yes Duration of hypertension: chronic BP monitoring frequency:  not checking BP range:  BP medication side effects:  no Medication compliance: good compliance Aspirin: no Recurrent headaches: no Visual changes: no Palpitations: no Dyspnea: no Chest pain: no Lower extremity edema: occasional at end of day Dizzy/lightheaded: no  The 10-year ASCVD risk score (Arnett DK, et al., 2019) is: 3.4%   Values used to calculate the score:     Age: 18 years     Sex: Male     Is Non-Hispanic African American: No     Diabetic: Yes     Tobacco smoker: No     Systolic Blood Pressure: 116 mmHg     Is BP treated: Yes     HDL Cholesterol: 32 mg/dL     Total Cholesterol: 174 mg/dL   Relevant past medical, surgical, family and social history  reviewed and updated as indicated. Interim medical history since our last visit reviewed. Allergies and medications reviewed and updated.  Review of Systems  Constitutional:  Negative for activity change, diaphoresis, fatigue and fever.  Respiratory:  Negative for cough, chest tightness, shortness of breath and wheezing.   Cardiovascular:  Negative for chest pain, palpitations and leg swelling.  Gastrointestinal: Negative.   Endocrine: Negative for cold intolerance, heat intolerance, polydipsia, polyphagia and polyuria.  Neurological: Negative.   Psychiatric/Behavioral: Negative.     Per HPI unless specifically indicated above     Objective:    BP 116/75   Pulse 73   Temp 98.2 F (36.8 C) (Oral)   Ht 6' 1.39" (1.864 m)   Wt (!) 356 lb 12.8 oz (161.8 kg)   SpO2 95%   BMI 46.58 kg/m   Wt Readings from Last 3 Encounters:  09/10/22 (!) 356 lb 12.8 oz (161.8 kg)  07/10/22 (!) 395 lb 6.4 oz (179.4 kg)  06/12/22 (!) 393 lb 6.4 oz (178.4 kg)    Physical Exam Vitals and nursing note reviewed.  Constitutional:      General: He is awake. He is not in acute distress.    Appearance: He is well-developed. He is morbidly obese. He is not ill-appearing.  HENT:     Head: Normocephalic and atraumatic.     Right Ear:  Hearing normal. No drainage.     Left Ear: Hearing normal. No drainage.  Eyes:     General: Lids are normal.        Right eye: No discharge.        Left eye: No discharge.     Conjunctiva/sclera: Conjunctivae normal.     Pupils: Pupils are equal, round, and reactive to light.  Neck:     Vascular: No carotid bruit.  Cardiovascular:     Rate and Rhythm: Normal rate and regular rhythm.     Heart sounds: Normal heart sounds, S1 normal and S2 normal. No murmur heard.    No gallop.  Pulmonary:     Effort: Pulmonary effort is normal. No accessory muscle usage or respiratory distress.     Breath sounds: Normal breath sounds.  Abdominal:     General: Bowel sounds are normal.      Palpations: Abdomen is soft.  Musculoskeletal:        General: Normal range of motion.     Cervical back: Normal range of motion and neck supple.     Right lower leg: No edema.     Left lower leg: No edema.  Skin:    General: Skin is warm and dry.  Neurological:     Mental Status: He is alert and oriented to person, place, and time.  Psychiatric:        Mood and Affect: Mood normal.        Behavior: Behavior normal. Behavior is cooperative.        Thought Content: Thought content normal.        Judgment: Judgment normal.    Results for orders placed or performed in visit on 06/12/22  Bayer DCA Hb A1c Waived  Result Value Ref Range   HB A1C (BAYER DCA - WAIVED) 6.0 (H) 4.8 - 5.6 %  Microalbumin, Urine Waived  Result Value Ref Range   Microalb, Ur Waived 30 (H) 0 - 19 mg/L   Creatinine, Urine Waived 50 10 - 300 mg/dL   Microalb/Creat Ratio 30-300 (H) <30 mg/g  Comprehensive metabolic panel  Result Value Ref Range   Glucose 95 70 - 99 mg/dL   BUN 12 6 - 24 mg/dL   Creatinine, Ser 1.61 0.76 - 1.27 mg/dL   eGFR 93 >09 UE/AVW/0.98   BUN/Creatinine Ratio 12 9 - 20   Sodium 138 134 - 144 mmol/L   Potassium 3.9 3.5 - 5.2 mmol/L   Chloride 99 96 - 106 mmol/L   CO2 22 20 - 29 mmol/L   Calcium 9.4 8.7 - 10.2 mg/dL   Total Protein 6.9 6.0 - 8.5 g/dL   Albumin 4.4 4.1 - 5.1 g/dL   Globulin, Total 2.5 1.5 - 4.5 g/dL   Albumin/Globulin Ratio 1.8 1.2 - 2.2   Bilirubin Total 0.5 0.0 - 1.2 mg/dL   Alkaline Phosphatase 89 44 - 121 IU/L   AST 24 0 - 40 IU/L   ALT 39 0 - 44 IU/L  Lipid Panel w/o Chol/HDL Ratio  Result Value Ref Range   Cholesterol, Total 174 100 - 199 mg/dL   Triglycerides 119 (H) 0 - 149 mg/dL   HDL 32 (L) >14 mg/dL   VLDL Cholesterol Cal 29 5 - 40 mg/dL   LDL Chol Calc (NIH) 782 (H) 0 - 99 mg/dL  TSH  Result Value Ref Range   TSH 0.913 0.450 - 4.500 uIU/mL      Assessment & Plan:   Problem List Items  Addressed This Visit       Cardiovascular and  Mediastinum   Hypertension associated with diabetes (HCC)    Chronic, stable.  BP at goal in office.  Continue Lisinopril at 10 MG, maintain it for kidney protection with proteinuria.  Urine ALB 16 June 2022.  Recommend continued focus on diet and weight loss at home.  LABS: CMP.  Refills sent.      Relevant Medications   Semaglutide, 1 MG/DOSE, 4 MG/3ML SOPN   Other Relevant Orders   Bayer DCA Hb A1c Waived     Endocrine   Hyperlipidemia associated with type 2 diabetes mellitus (HCC)    Chronic, ongoing, refuses statin.  Is focused on diet and exercise.  Will recheck lipid panel today and consider statin if elevations.      Relevant Medications   Semaglutide, 1 MG/DOSE, 4 MG/3ML SOPN   Other Relevant Orders   Bayer DCA Hb A1c Waived   Lipid Panel w/o Chol/HDL Ratio   Comprehensive metabolic panel   Type 2 diabetes mellitus with obesity (HCC) - Primary    Chronic, ongoing with A1c 5.3% today with 39 lbs weight loss.  Urine ALB 16 June 2022.  Initial diagnosis September 2020 with A1c 6.9%.  Tolerating Ozempic well without ADR. - Tolerating Ozempic, recommend he go up to 1 MG dosing (recently accidentally threw out pen and can not get refills until July 1st).  This increase may offer benefit further to weight loss journey and diabetes. - Is on ACE for kidney protection, continue this.  No current statin, refuses - Needs eye exam, discussed with patient, referral placed last visit and recommend he schedule.  Foot exam up to date. - Refuses vaccinations - Continue to monitor BS at home occasionally and focus on diet and weight loss.   Return to office in 3 months.      Relevant Medications   Semaglutide, 1 MG/DOSE, 4 MG/3ML SOPN   Other Relevant Orders   Bayer DCA Hb A1c Waived     Other   BMI 50.0-59.9, adult (HCC)    Refer to morbid obesity plan of care.      Relevant Medications   Semaglutide, 1 MG/DOSE, 4 MG/3ML SOPN   Morbid obesity (HCC)    BMI 46.58 with T2DM and HTN,  has had 39 lbs loss with Ozempic.  Recommended eating smaller high protein, low fat meals more frequently and exercising 30 mins a day 5 times a week with a goal of 10-15lb weight loss in the next 3 months. Patient voiced their understanding and motivation to adhere to these recommendations.         Relevant Medications   Semaglutide, 1 MG/DOSE, 4 MG/3ML SOPN     Follow up plan: Return in about 3 months (around 12/11/2022) for T2DM, HTN/HLD.

## 2022-09-10 NOTE — Assessment & Plan Note (Signed)
Refer to morbid obesity plan of care. 

## 2022-09-10 NOTE — Assessment & Plan Note (Signed)
Chronic, ongoing with A1c 5.3% today with 39 lbs weight loss.  Urine ALB 16 June 2022.  Initial diagnosis September 2020 with A1c 6.9%.  Tolerating Ozempic well without ADR. - Tolerating Ozempic, recommend he go up to 1 MG dosing (recently accidentally threw out pen and can not get refills until July 1st).  This increase may offer benefit further to weight loss journey and diabetes. - Is on ACE for kidney protection, continue this.  No current statin, refuses - Needs eye exam, discussed with patient, referral placed last visit and recommend he schedule.  Foot exam up to date. - Refuses vaccinations - Continue to monitor BS at home occasionally and focus on diet and weight loss.   Return to office in 3 months.

## 2022-09-10 NOTE — Assessment & Plan Note (Signed)
Chronic, ongoing, refuses statin.  Is focused on diet and exercise.  Will recheck lipid panel today and consider statin if elevations. 

## 2022-09-11 LAB — COMPREHENSIVE METABOLIC PANEL
ALT: 27 IU/L (ref 0–44)
AST: 23 IU/L (ref 0–40)
Albumin: 4.4 g/dL (ref 4.1–5.1)
Alkaline Phosphatase: 81 IU/L (ref 44–121)
BUN/Creatinine Ratio: 12 (ref 9–20)
BUN: 12 mg/dL (ref 6–24)
Bilirubin Total: 0.3 mg/dL (ref 0.0–1.2)
CO2: 22 mmol/L (ref 20–29)
Calcium: 9.2 mg/dL (ref 8.7–10.2)
Chloride: 106 mmol/L (ref 96–106)
Creatinine, Ser: 1 mg/dL (ref 0.76–1.27)
Globulin, Total: 2.3 g/dL (ref 1.5–4.5)
Glucose: 98 mg/dL (ref 70–99)
Potassium: 3.8 mmol/L (ref 3.5–5.2)
Sodium: 143 mmol/L (ref 134–144)
Total Protein: 6.7 g/dL (ref 6.0–8.5)
eGFR: 97 mL/min/{1.73_m2} (ref >59)

## 2022-09-11 LAB — LIPID PANEL W/O CHOL/HDL RATIO
Cholesterol, Total: 153 mg/dL (ref 100–199)
HDL: 33 mg/dL — ABNORMAL LOW (ref >39)
LDL Chol Calc (NIH): 85 mg/dL (ref 0–99)
Triglycerides: 209 mg/dL — ABNORMAL HIGH (ref 0–149)
VLDL Cholesterol Cal: 35 mg/dL (ref 5–40)

## 2022-09-11 NOTE — Progress Notes (Signed)
Contacted via MyChart   Good afternoon Brian Thornton, your labs have returned: - Kidney function, creatinine and eGFR, remains normal, as is liver function, AST and ALT.  - Cholesterol levels are trending down with weight loss and Ozempic on board.  Continue this for now and we will continue to monitor closely and consider statin therapy in future.  Any questions? Keep being amazing!!  Thank you for allowing me to participate in your care.  I appreciate you. Kindest regards, Maydelin Deming

## 2022-12-08 NOTE — Patient Instructions (Signed)
 Be Involved in Caring For Your Health:  Taking Medications When medications are taken as directed, they can greatly improve your health. But if they are not taken as prescribed, they may not work. In some cases, not taking them correctly can be harmful. To help ensure your treatment remains effective and safe, understand your medications and how to take them. Bring your medications to each visit for review by your provider.  Your lab results, notes, and after visit summary will be available on My Chart. We strongly encourage you to use this feature. If lab results are abnormal the clinic will contact you with the appropriate steps. If the clinic does not contact you assume the results are satisfactory. You can always view your results on My Chart. If you have questions regarding your health or results, please contact the clinic during office hours. You can also ask questions on My Chart.  We at [facility] are grateful that you chose [place] to provide your care. We strive to provide evidence-based and compassionate care and are always looking for feedback. If you get a survey from the clinic please complete this so we can hear your opinions.  Diabetes Mellitus and Nutrition, Adult When you have diabetes, or diabetes mellitus, it is very important to have healthy eating habits because your blood sugar (glucose) levels are greatly affected by what you eat and drink. Eating healthy foods in the right amounts, at about the same times every day, can help you: Manage your blood glucose. Lower your risk of heart disease. Improve your blood pressure. Reach or maintain a healthy weight. What can affect my meal plan? Every person with diabetes is different, and each person has different needs for a meal plan. Your health care provider may recommend that you work with a dietitian to make a meal plan that is best for you. Your meal plan may vary depending on factors such as: The calories you need. The  medicines you take. Your weight. Your blood glucose, blood pressure, and cholesterol levels. Your activity level. Other health conditions you have, such as heart or kidney disease. How do carbohydrates affect me? Carbohydrates, also called carbs, affect your blood glucose level more than any other type of food. Eating carbs raises the amount of glucose in your blood. It is important to know how many carbs you can safely have in each meal. This is different for every person. Your dietitian can help you calculate how many carbs you should have at each meal and for each snack. How does alcohol affect me? Alcohol can cause a decrease in blood glucose (hypoglycemia), especially if you use insulin or take certain diabetes medicines by mouth. Hypoglycemia can be a life-threatening condition. Symptoms of hypoglycemia, such as sleepiness, dizziness, and confusion, are similar to symptoms of having too much alcohol. Do not drink alcohol if: Your health care provider tells you not to drink. You are pregnant, may be pregnant, or are planning to become pregnant. If you drink alcohol: Limit how much you have to: 0-1 drink a day for women. 0-2 drinks a day for men. Know how much alcohol is in your drink. In the [place] one drink equals one 12 oz bottle of beer (355 mL), one 5 oz glass of wine (148 mL), or one 1 oz glass of hard liquor (44 mL). Keep yourself hydrated with water, diet soda, or unsweetened iced tea. Keep in mind that regular soda, juice, and other mixers may contain a lot of sugar and must  be counted as carbs. What are tips for following this plan?  Reading food labels Start by checking the serving size on the Nutrition Facts label of packaged foods and drinks. The number of calories and the amount of carbs, fats, and other nutrients listed on the label are based on one serving of the item. Many items contain more than one serving per package. Check the total grams (g) of carbs in one  serving. Check the number of grams of saturated fats and trans fats in one serving. Choose foods that have a low amount or none of these fats. Check the number of milligrams (mg) of salt (sodium) in one serving. Most people should limit total sodium intake to less than 2,300 mg per day. Always check the nutrition information of foods labeled as "low-fat" or "nonfat." These foods may be higher in added sugar or refined carbs and should be avoided. Talk to your dietitian to identify your daily goals for nutrients listed on the label. Shopping Avoid buying canned, pre-made, or processed foods. These foods tend to be high in fat, sodium, and added sugar. Shop around the outside edge of the grocery store. This is where you will most often find fresh fruits and vegetables, bulk grains, fresh meats, and fresh dairy products. Cooking Use low-heat cooking methods, such as baking, instead of high-heat cooking methods, such as deep frying. Cook using healthy oils, such as olive, canola, or sunflower oil. Avoid cooking with butter, cream, or high-fat meats. Meal planning Eat meals and snacks regularly, preferably at the same times every day. Avoid going long periods of time without eating. Eat foods that are high in fiber, such as fresh fruits, vegetables, beans, and whole grains. Eat 4-6 oz (112-168 g) of lean protein each day, such as lean meat, chicken, fish, eggs, or tofu. One ounce (oz) (28 g) of lean protein is equal to: 1 oz (28 g) of meat, chicken, or fish. 1 egg.  cup (62 g) of tofu. Eat some foods each day that contain healthy fats, such as avocado, nuts, seeds, and fish. What foods should I eat? Fruits Berries. Apples. Oranges. Peaches. Apricots. Plums. Grapes. Mangoes. Papayas. Pomegranates. Kiwi. Cherries. Vegetables Leafy greens, including lettuce, spinach, kale, chard, collard greens, mustard greens, and cabbage. Beets. Cauliflower. Broccoli. Carrots. Green beans. Tomatoes. Peppers.  Onions. Cucumbers. Brussels sprouts. Grains Whole grains, such as whole-wheat or whole-grain bread, crackers, tortillas, cereal, and pasta. Unsweetened oatmeal. Quinoa. Brown or wild rice. Meats and other proteins Seafood. Poultry without skin. Lean cuts of poultry and beef. Tofu. Nuts. Seeds. Dairy Low-fat or fat-free dairy products such as milk, yogurt, and cheese. The items listed above may not be a complete list of foods and beverages you can eat and drink. Contact a dietitian for more information. What foods should I avoid? Fruits Fruits canned with syrup. Vegetables Canned vegetables. Frozen vegetables with butter or cream sauce. Grains Refined white flour and flour products such as bread, pasta, snack foods, and cereals. Avoid all processed foods. Meats and other proteins Fatty cuts of meat. Poultry with skin. Breaded or fried meats. Processed meat. Avoid saturated fats. Dairy Full-fat yogurt, cheese, or milk. Beverages Sweetened drinks, such as soda or iced tea. The items listed above may not be a complete list of foods and beverages you should avoid. Contact a dietitian for more information. Questions to ask a health care provider Do I need to meet with a certified diabetes care and education specialist? Do I need to meet with a  dietitian? What number can I call if I have questions? When are the best times to check my blood glucose? Where to find more information: American Diabetes Association: diabetes.org Academy of Nutrition and Dietetics: eatright[employer] of Diabetes and Digestive and Kidney Diseases: StageSync.si Association of Diabetes Care & Education Specialists: diabeteseducator.org Summary It is important to have healthy eating habits because your blood sugar (glucose) levels are greatly affected by what you eat and drink. It is important to use alcohol carefully. A healthy meal plan will help you manage your blood glucose and lower your risk of  heart disease. Your health care provider may recommend that you work with a dietitian to make a meal plan that is best for you. This information is not intended to replace advice given to you by your health care provider. Make sure you discuss any questions you have with your health care provider. Document Revised: 10/07/2019 Document Reviewed: 10/07/2019 Elsevier Patient Education  2024 [employer].

## 2022-12-12 ENCOUNTER — Ambulatory Visit: Payer: BC Managed Care – PPO | Admitting: Nurse Practitioner

## 2022-12-12 ENCOUNTER — Encounter: Payer: Self-pay | Admitting: Nurse Practitioner

## 2022-12-12 VITALS — BP 128/80 | HR 75 | Temp 98.1°F | Wt 362.6 lb

## 2022-12-12 DIAGNOSIS — Z6841 Body Mass Index (BMI) 40.0 and over, adult: Secondary | ICD-10-CM

## 2022-12-12 DIAGNOSIS — I152 Hypertension secondary to endocrine disorders: Secondary | ICD-10-CM

## 2022-12-12 DIAGNOSIS — E1159 Type 2 diabetes mellitus with other circulatory complications: Secondary | ICD-10-CM | POA: Diagnosis not present

## 2022-12-12 DIAGNOSIS — E785 Hyperlipidemia, unspecified: Secondary | ICD-10-CM | POA: Diagnosis not present

## 2022-12-12 DIAGNOSIS — E1169 Type 2 diabetes mellitus with other specified complication: Secondary | ICD-10-CM

## 2022-12-12 DIAGNOSIS — Z7985 Long-term (current) use of injectable non-insulin antidiabetic drugs: Secondary | ICD-10-CM

## 2022-12-12 DIAGNOSIS — Z2821 Immunization not carried out because of patient refusal: Secondary | ICD-10-CM

## 2022-12-12 DIAGNOSIS — E669 Obesity, unspecified: Secondary | ICD-10-CM | POA: Diagnosis not present

## 2022-12-12 MED ORDER — SEMAGLUTIDE (2 MG/DOSE) 8 MG/3ML ~~LOC~~ SOPN: 2.0000 mg | PEN_INJECTOR | SUBCUTANEOUS | 12 refills | Status: DC

## 2022-12-12 NOTE — Assessment & Plan Note (Signed)
Chronic, ongoing, refuses statin.  Is focused on diet and exercise.  Will recheck lipid panel today and consider statin if elevations.

## 2022-12-12 NOTE — Assessment & Plan Note (Signed)
BMI 47.34 with T2DM and HTN.  Recommended eating smaller high protein, low fat meals more frequently and exercising 30 mins a day 5 times a week with a goal of 10-15lb weight loss in the next 3 months. Patient voiced their understanding and motivation to adhere to these recommendations.

## 2022-12-12 NOTE — Assessment & Plan Note (Signed)
Chronic, ongoing with A1c 5.3% last visit with 39 lbs weight loss, but has gained some back this visit and is worried it will be higher.  Recheck today.  Urine ALB 16 June 2022.  Initial diagnosis September 2020 with A1c 6.9%.  Tolerating Ozempic well without ADR. - Tolerating Ozempic, recommend he go up to 2 MG dosing.  This increase may offer benefit further to weight loss journey and diabetes -- 1 MG wears off before end of week. - Is on ACE for kidney protection, continue this.  No current statin, refuses - Needs eye exam, discussed with patient, referral placed last visit and recommend he schedule.  Foot exam up to date. - Refuses vaccinations - Continue to monitor BS at home occasionally and focus on diet and weight loss.   Return to office in 3 months.

## 2022-12-12 NOTE — Progress Notes (Signed)
BP 128/80 (BP Location: Left Arm, Patient Position: Sitting, Cuff Size: Large)   Pulse 75   Temp 98.1 F (36.7 C) (Oral)   Wt (!) 362 lb 9.6 oz (164.5 kg)   SpO2 98%   BMI 47.34 kg/m    Subjective:    Patient ID: Brian Thornton, male    DOB: June 02, 1980, 42 y.o.   MRN: [id number]  HPI: Brian Thornton is a 43 y.o. male  Chief Complaint  Patient presents with   Diabetes   Hyperlipidemia   Hypertension   DIABETES A1c 5.3% June.  Taking Ozempic 1 MG weekly and tolerating well with no major side effect.  Had lost 39 pounds since starting Ozempic, but has gained some back since last visit -- reports medicine wears off near end of week.  Has been more tired recently, is worried about sugars.   Hypoglycemic episodes:no Polydipsia/polyuria: no  Visual disturbance: no Chest pain: no Paresthesias: no Glucose Monitoring: no  Accucheck frequency: Not Checking  Fasting glucose:  Post prandial:  Evening:  Before meals: Taking Insulin?: no  Long acting insulin:  Short acting insulin: Blood Pressure Monitoring: not checking Retinal Examination: Not up to Date -- was ordered, he has not called back Foot Exam: Up to Date Pneumovax:  refused Influenza:  refused Aspirin: no   BMI Metric Follow Up - 12/12/22 1005       BMI Metric Follow Up-Please document annually   BMI Metric Follow Up Nutrition counseling              HYPERTENSION  Continues on Lisinopril 10 MG daily. Hypertension status: stable  Satisfied with current treatment? yes Duration of hypertension: chronic BP monitoring frequency:  not checking BP range:  BP medication side effects:  no Medication compliance: good compliance Aspirin: no Recurrent headaches: no Visual changes: no Palpitations: no Dyspnea: no Chest pain: no Lower extremity edema: occasional near end of day Dizzy/lightheaded: no  The 10-year ASCVD risk score (Arnett DK, et al., 2019) is: 3%   Values used to calculate the score:      Age: 81 years     Sex: Male     Is Non-Hispanic African American: No     Diabetic: Yes     Tobacco smoker: No     Systolic Blood Pressure: 128 mmHg     Is BP treated: Yes     HDL Cholesterol: 33 mg/dL     Total Cholesterol: 153 mg/dL   Relevant past medical, surgical, family and social history reviewed and updated as indicated. Interim medical history since our last visit reviewed. Allergies and medications reviewed and updated.  Review of Systems  Constitutional:  Negative for activity change, diaphoresis, fatigue and fever.  Respiratory:  Negative for cough, chest tightness, shortness of breath and wheezing.   Cardiovascular:  Negative for chest pain, palpitations and leg swelling.  Gastrointestinal: Negative.   Endocrine: Negative for cold intolerance, heat intolerance, polydipsia, polyphagia and polyuria.  Neurological: Negative.   Psychiatric/Behavioral: Negative.     Per HPI unless specifically indicated above     Objective:    BP 128/80 (BP Location: Left Arm, Patient Position: Sitting, Cuff Size: Large)   Pulse 75   Temp 98.1 F (36.7 C) (Oral)   Wt (!) 362 lb 9.6 oz (164.5 kg)   SpO2 98%   BMI 47.34 kg/m   Wt Readings from Last 3 Encounters:  12/12/22 (!) 362 lb 9.6 oz (164.5 kg)  09/10/22 (!) 356 lb 12.8 oz (  161.8 kg)  07/10/22 (!) 395 lb 6.4 oz (179.4 kg)    Physical Exam Vitals and nursing note reviewed.  Constitutional:      General: He is awake. He is not in acute distress.    Appearance: Normal appearance. He is well-developed and well-groomed. He is obese. He is not ill-appearing or toxic-appearing.  HENT:     Head: Normocephalic and atraumatic.     Right Ear: Hearing normal. No drainage.     Left Ear: Hearing normal. No drainage.  Eyes:     General: Lids are normal.        Right eye: No discharge.        Left eye: No discharge.     Conjunctiva/sclera: Conjunctivae normal.     Pupils: Pupils are equal, round, and reactive to light.  Neck:      Vascular: No carotid bruit.  Cardiovascular:     Rate and Rhythm: Normal rate and regular rhythm.     Heart sounds: Normal heart sounds, S1 normal and S2 normal. No murmur heard.    No gallop.  Pulmonary:     Effort: Pulmonary effort is normal. No accessory muscle usage or respiratory distress.     Breath sounds: Normal breath sounds.  Abdominal:     General: Bowel sounds are normal. There is no distension.     Palpations: Abdomen is soft.     Tenderness: There is no abdominal tenderness.  Musculoskeletal:        General: Normal range of motion.     Cervical back: Normal range of motion and neck supple.     Right lower leg: No edema.     Left lower leg: No edema.  Skin:    General: Skin is warm and dry.  Neurological:     Mental Status: He is alert and oriented to person, place, and time.     Deep Tendon Reflexes:     Reflex Scores:      Brachioradialis reflexes are 2+ on the right side and 2+ on the left side.      Patellar reflexes are 2+ on the right side and 2+ on the left side. Psychiatric:        Attention and Perception: Attention normal.        Mood and Affect: Mood normal.        Speech: Speech normal.        Behavior: Behavior normal. Behavior is cooperative.        Thought Content: Thought content normal.    Results for orders placed or performed in visit on 09/10/22  Bayer DCA Hb A1c Waived  Result Value Ref Range   HB A1C (BAYER DCA - WAIVED) 5.3 4.8 - 5.6 %  Lipid Panel w/o Chol/HDL Ratio  Result Value Ref Range   Cholesterol, Total 153 100 - 199 mg/dL   Triglycerides 161 (H) 0 - 149 mg/dL   HDL 33 (L) >09 mg/dL   VLDL Cholesterol Cal 35 5 - 40 mg/dL   LDL Chol Calc (NIH) 85 0 - 99 mg/dL  Comprehensive metabolic panel  Result Value Ref Range   Glucose 98 70 - 99 mg/dL   BUN 12 6 - 24 mg/dL   Creatinine, Ser 6.04 0.76 - 1.27 mg/dL   eGFR 97 >54 UJ/WJX/9.14   BUN/Creatinine Ratio 12 9 - 20   Sodium 143 134 - 144 mmol/L   Potassium 3.8 3.5 - 5.2 mmol/L    Chloride 106 96 - 106 mmol/L  CO2 22 20 - 29 mmol/L   Calcium 9.2 8.7 - 10.2 mg/dL   Total Protein 6.7 6.0 - 8.5 g/dL   Albumin 4.4 4.1 - 5.1 g/dL   Globulin, Total 2.3 1.5 - 4.5 g/dL   Bilirubin Total 0.3 0.0 - 1.2 mg/dL   Alkaline Phosphatase 81 44 - 121 IU/L   AST 23 0 - 40 IU/L   ALT 27 0 - 44 IU/L      Assessment & Plan:   Problem List Items Addressed This Visit       Cardiovascular and Mediastinum   Hypertension associated with diabetes (HCC)    Chronic, stable.  BP at goal in office today.  Continue Lisinopril at 10 MG, maintain it for kidney protection with proteinuria.  Urine ALB 16 June 2022.  Recommend continued focus on diet and weight loss at home.  LABS: BMP.  Refills up to date.      Relevant Medications   Semaglutide, 2 MG/DOSE, 8 MG/3ML SOPN   Other Relevant Orders   HgB A1c   Basic metabolic panel     Endocrine   Hyperlipidemia associated with type 2 diabetes mellitus (HCC)    Chronic, ongoing, refuses statin.  Is focused on diet and exercise.  Will recheck lipid panel today and consider statin if elevations.      Relevant Medications   Semaglutide, 2 MG/DOSE, 8 MG/3ML SOPN   Other Relevant Orders   HgB A1c   Lipid Panel w/o Chol/HDL Ratio   Type 2 diabetes mellitus with obesity (HCC) - Primary    Chronic, ongoing with A1c 5.3% last visit with 39 lbs weight loss, but has gained some back this visit and is worried it will be higher.  Recheck today.  Urine ALB 16 June 2022.  Initial diagnosis September 2020 with A1c 6.9%.  Tolerating Ozempic well without ADR. - Tolerating Ozempic, recommend he go up to 2 MG dosing.  This increase may offer benefit further to weight loss journey and diabetes -- 1 MG wears off before end of week. - Is on ACE for kidney protection, continue this.  No current statin, refuses - Needs eye exam, discussed with patient, referral placed last visit and recommend he schedule.  Foot exam up to date. - Refuses vaccinations -  Continue to monitor BS at home occasionally and focus on diet and weight loss.   Return to office in 3 months.      Relevant Medications   Semaglutide, 2 MG/DOSE, 8 MG/3ML SOPN   Other Relevant Orders   HgB A1c     Other   BMI 45.0-49.9, adult (HCC)    BMI 47.34 with T2DM and HTN.  Recommended eating smaller high protein, low fat meals more frequently and exercising 30 mins a day 5 times a week with a goal of 10-15lb weight loss in the next 3 months. Patient voiced their understanding and motivation to adhere to these recommendations.       Relevant Medications   Semaglutide, 2 MG/DOSE, 8 MG/3ML SOPN   Morbid obesity (HCC)    BMI 47.34 with T2DM and HTN, has had 39 lbs loss with Ozempic.  Recommended eating smaller high protein, low fat meals more frequently and exercising 30 mins a day 5 times a week with a goal of 10-15lb weight loss in the next 3 months. Patient voiced their understanding and motivation to adhere to these recommendations.         Relevant Medications   Semaglutide, 2 MG/DOSE, 8 MG/3ML  SOPN   Other Visit Diagnoses     Flu vaccine refused       Refuses all vaccinations.  Continue to discuss.        Follow up plan: Return in about 3 months (around 03/13/2023) for T2DM, HTN/HLD.

## 2022-12-12 NOTE — Assessment & Plan Note (Signed)
Chronic, stable.  BP at goal in office today.  Continue Lisinopril at 10 MG, maintain it for kidney protection with proteinuria.  Urine ALB 16 June 2022.  Recommend continued focus on diet and weight loss at home.  LABS: BMP.  Refills up to date.

## 2022-12-12 NOTE — Assessment & Plan Note (Signed)
BMI 47.34 with T2DM and HTN, has had 39 lbs loss with Ozempic.  Recommended eating smaller high protein, low fat meals more frequently and exercising 30 mins a day 5 times a week with a goal of 10-15lb weight loss in the next 3 months. Patient voiced their understanding and motivation to adhere to these recommendations.

## 2022-12-13 LAB — BASIC METABOLIC PANEL
BUN/Creatinine Ratio: 13 (ref 9–20)
BUN: 13 mg/dL (ref 6–24)
CO2: 22 mmol/L (ref 20–29)
Calcium: 9.4 mg/dL (ref 8.7–10.2)
Chloride: 104 mmol/L (ref 96–106)
Creatinine, Ser: 1.04 mg/dL (ref 0.76–1.27)
Glucose: 86 mg/dL (ref 70–99)
Potassium: 4.2 mmol/L (ref 3.5–5.2)
Sodium: 141 mmol/L (ref 134–144)
eGFR: 93 mL/min/{1.73_m2} (ref >59)

## 2022-12-13 LAB — LIPID PANEL W/O CHOL/HDL RATIO
Cholesterol, Total: 168 mg/dL (ref 100–199)
HDL: 37 mg/dL — ABNORMAL LOW (ref >39)
LDL Chol Calc (NIH): 105 mg/dL — ABNORMAL HIGH (ref 0–99)
Triglycerides: 145 mg/dL (ref 0–149)
VLDL Cholesterol Cal: 26 mg/dL (ref 5–40)

## 2022-12-13 LAB — HEMOGLOBIN A1C
Est. average glucose Bld gHb Est-mCnc: 114 mg/dL
Hgb A1c MFr Bld: 5.6 % (ref 4.8–5.6)

## 2022-12-13 NOTE — Progress Notes (Signed)
Contacted via MyChart   Good morning Brian Thornton, your labs have returned: - Cholesterol levels continue to have elevated LDL and low HDL, I do continue to recommend statin therapy to protect you from stroke or heart event, let me know if you wish to try a statin. - Kidney function, creatinine and eGFR, remains normal. - A1c remains stable at 5.6%, continue Ozempic.  It did creep up a little from 5.3%, but still stable.  Any questions? Keep being excellent!!  Thank you for allowing me to participate in your care.  I appreciate you. Kindest regards, Sabastian Raimondi

## 2023-01-07 ENCOUNTER — Other Ambulatory Visit: Payer: Self-pay | Admitting: Nurse Practitioner

## 2023-01-07 MED ORDER — INDOMETHACIN 50 MG PO CAPS: 50.0000 mg | ORAL_CAPSULE | Freq: Three times a day (TID) | ORAL | 0 refills | Status: DC | PRN

## 2023-01-08 NOTE — Telephone Encounter (Signed)
Requested Prescriptions  Refused Prescriptions Disp Refills   indomethacin (INDOCIN) 50 MG capsule [Pharmacy Med Name: INDOMETHACIN 50 MG CAPSULE] 30 capsule 0    Sig: Take 1 capsule (50 mg total) by mouth 3 (three) times daily as needed. As needed for Gout flares     Analgesics:  NSAIDS Failed - 01/07/2023 11:56 AM      Failed - Manual Review: Labs are only required if the patient has taken medication for more than 8 weeks.      Failed - HGB in normal range and within 360 days    Hemoglobin  Date Value Ref Range Status  08/04/2021 17.2 13.0 - 17.7 g/dL Final         Failed - PLT in normal range and within 360 days    Platelets  Date Value Ref Range Status  08/04/2021 179 150 - 450 x10E3/uL Final         Failed - HCT in normal range and within 360 days    Hematocrit  Date Value Ref Range Status  08/04/2021 51.1 (H) 37.5 - 51.0 % Final         Passed - Cr in normal range and within 360 days    Creatinine, Ser  Date Value Ref Range Status  12/12/2022 1.04 0.76 - 1.27 mg/dL Final         Passed - eGFR is 30 or above and within 360 days    GFR calc Af Amer  Date Value Ref Range Status  01/01/2020 92 >59 mL/min/1.73 Final    Comment:    **In accordance with recommendations from the NKF-ASN Task force,**   [facility] is in the process of updating its eGFR calculation to the   2021 CKD-EPI creatinine equation that estimates kidney function   without a race variable.    GFR calc non Af Amer  Date Value Ref Range Status  01/01/2020 79 >59 mL/min/1.73 Final   eGFR  Date Value Ref Range Status  12/12/2022 93 >59 mL/min/1.73 Final         Passed - Patient is not pregnant      Passed - Valid encounter within last 12 months    Recent Outpatient Visits           3 weeks ago Type 2 diabetes mellitus with obesity (HCC)   [facility] [place], [place] T, NP   4 months ago Type 2 diabetes mellitus with obesity (HCC)   [facility] [place], Lindsay T, NP   6 months ago Type 2 diabetes mellitus with obesity (HCC)   [facility] [place], Perkins T, NP   7 months ago Type 2 diabetes mellitus with obesity (HCC)   [facility] [place], Corrie Dandy T, NP   1 year ago Acute gout of left elbow, unspecified cause   [facility] Larae G[place], NP       Future Appointments             In 2 months [place], Dorie Rank, NP [place] [facility]

## 2023-03-14 ENCOUNTER — Ambulatory Visit: Payer: BC Managed Care – PPO | Admitting: Nurse Practitioner

## 2023-03-14 ENCOUNTER — Encounter: Payer: Self-pay | Admitting: Nurse Practitioner

## 2023-03-14 VITALS — BP 112/73 | HR 66 | Temp 97.6°F | Ht 73.3 in | Wt 344.8 lb

## 2023-03-14 DIAGNOSIS — I152 Hypertension secondary to endocrine disorders: Secondary | ICD-10-CM | POA: Diagnosis not present

## 2023-03-14 DIAGNOSIS — E669 Obesity, unspecified: Secondary | ICD-10-CM

## 2023-03-14 DIAGNOSIS — E1159 Type 2 diabetes mellitus with other circulatory complications: Secondary | ICD-10-CM | POA: Diagnosis not present

## 2023-03-14 DIAGNOSIS — Z6841 Body Mass Index (BMI) 40.0 and over, adult: Secondary | ICD-10-CM | POA: Diagnosis not present

## 2023-03-14 DIAGNOSIS — Z7985 Long-term (current) use of injectable non-insulin antidiabetic drugs: Secondary | ICD-10-CM

## 2023-03-14 DIAGNOSIS — E1169 Type 2 diabetes mellitus with other specified complication: Secondary | ICD-10-CM

## 2023-03-14 DIAGNOSIS — M25571 Pain in right ankle and joints of right foot: Secondary | ICD-10-CM | POA: Insufficient documentation

## 2023-03-14 DIAGNOSIS — E785 Hyperlipidemia, unspecified: Secondary | ICD-10-CM

## 2023-03-14 DIAGNOSIS — N481 Balanitis: Secondary | ICD-10-CM

## 2023-03-14 DIAGNOSIS — M25572 Pain in left ankle and joints of left foot: Secondary | ICD-10-CM

## 2023-03-14 LAB — BAYER DCA HB A1C WAIVED: HB A1C (BAYER DCA - WAIVED): 5.3 % (ref 4.8–5.6)

## 2023-03-14 MED ORDER — INDOMETHACIN 50 MG PO CAPS: 50.0000 mg | ORAL_CAPSULE | Freq: Three times a day (TID) | ORAL | 4 refills | Status: DC | PRN

## 2023-03-14 NOTE — Assessment & Plan Note (Signed)
Ongoing for weeks and noticed more when getting up from sitting or getting out of bed.  Suspect some OA present.  Recommend stretching ankles prior to getting out of bed in morning + may use Tylenol as needed or Voltaren gel.  Wear compression to ankles which may off more support.  If ongoing to return to office and will obtain imaging.

## 2023-03-14 NOTE — Assessment & Plan Note (Signed)
Refer to morbid obesity plan of care. 

## 2023-03-14 NOTE — Assessment & Plan Note (Addendum)
Acute flares, currently with one present.  Will place referral for him to return to urology for assessment as has been having recurrent episodes.

## 2023-03-14 NOTE — Progress Notes (Signed)
BP 112/73 (BP Location: Left Arm, Cuff Size: Large)   Pulse 66   Temp 97.6 F (36.4 C) (Oral)   Ht 6' 1.3" (1.862 m)   Wt (!) 344 lb 12.8 oz (156.4 kg)   SpO2 96%   BMI 45.12 kg/m    Subjective:    Patient ID: Brian Thornton, male    DOB: [birth year], 42 y.o.   MRN: [id number]  HPI: Brian Thornton is a 42 y.o. male  Chief Complaint  Patient presents with   Diabetes    No recent eye exam per patient   Hyperlipidemia   Hypertension   Is having recurrence of balanitis often, saw urology for this last in 2020 and would like to return.  DIABETES A1c 5.6% September.  Taking Ozempic 2 MG weekly and tolerating well with no major side effect.  Had lost 44 pounds since starting Ozempic, has lost since last visit, 18 lbs.    At times in the morning has to take time getting up moving, feels like is stepping on sharp needles L>R foot.  Feels more in ankle.  History of surgery to left ankle in past.  The discomfort lasts 10 minutes and then will go away after moving.  When rests again and gets up pain will be present again.  Rates it 10/10 when present.  Occasionally takes Tylenol or Advil. Hypoglycemic episodes:no Polydipsia/polyuria: no  Visual disturbance: no Chest pain: no Paresthesias: no Glucose Monitoring: no  Accucheck frequency: Not Checking  Fasting glucose:  Post prandial:  Evening:  Before meals: Taking Insulin?: no  Long acting insulin:  Short acting insulin: Blood Pressure Monitoring: not checking Retinal Examination: Not up to Date -- was ordered -- he needs to call back Foot Exam: Up to Date Pneumovax:  refused Influenza:  refused Aspirin: no    BMI Metric Follow Up - 03/14/23 0919       BMI Metric Follow Up-Please document annually   BMI Metric Follow Up Nutrition counseling             HYPERTENSION  Continues on Lisinopril 10 MG daily. Hypertension status: stable  Satisfied with current treatment? yes Duration of hypertension: chronic BP  monitoring frequency:  not checking BP range:  BP medication side effects:  no Medication compliance: good compliance Aspirin: no Recurrent headaches: no Visual changes: no Palpitations: no Dyspnea: no Chest pain: no Lower extremity edema: occasional at end of day Dizzy/lightheaded: no  The 10-year ASCVD risk score (Arnett DK, et al., 2019) is: 2.4%   Values used to calculate the score:     Age: 89 years     Sex: Male     Is Non-Hispanic African American: No     Diabetic: Yes     Tobacco smoker: No     Systolic Blood Pressure: 112 mmHg     Is BP treated: Yes     HDL Cholesterol: 37 mg/dL     Total Cholesterol: 168 mg/dL   Relevant past medical, surgical, family and social history reviewed and updated as indicated. Interim medical history since our last visit reviewed. Allergies and medications reviewed and updated.  Review of Systems  Constitutional:  Negative for activity change, diaphoresis, fatigue and fever.  Respiratory:  Negative for cough, chest tightness, shortness of breath and wheezing.   Cardiovascular:  Negative for chest pain, palpitations and leg swelling.  Gastrointestinal: Negative.   Endocrine: Negative for cold intolerance, heat intolerance, polydipsia, polyphagia and polyuria.  Genitourinary:  Positive for penile  swelling.  Musculoskeletal:  Positive for arthralgias.  Neurological: Negative.   Psychiatric/Behavioral: Negative.     Per HPI unless specifically indicated above     Objective:    BP 112/73 (BP Location: Left Arm, Cuff Size: Large)   Pulse 66   Temp 97.6 F (36.4 C) (Oral)   Ht 6' 1.3" (1.862 m)   Wt (!) 344 lb 12.8 oz (156.4 kg)   SpO2 96%   BMI 45.12 kg/m   Wt Readings from Last 3 Encounters:  03/14/23 (!) 344 lb 12.8 oz (156.4 kg)  12/12/22 (!) 362 lb 9.6 oz (164.5 kg)  09/10/22 (!) 356 lb 12.8 oz (161.8 kg)    Physical Exam Vitals and nursing note reviewed.  Constitutional:      General: He is awake. He is not in acute  distress.    Appearance: Normal appearance. He is well-developed and well-groomed. He is obese. He is not ill-appearing or toxic-appearing.  HENT:     Head: Normocephalic and atraumatic.     Right Ear: Hearing normal. No drainage.     Left Ear: Hearing normal. No drainage.  Eyes:     General: Lids are normal.        Right eye: No discharge.        Left eye: No discharge.     Conjunctiva/sclera: Conjunctivae normal.     Pupils: Pupils are equal, round, and reactive to light.  Neck:     Vascular: No carotid bruit.  Cardiovascular:     Rate and Rhythm: Normal rate and regular rhythm.     Heart sounds: Normal heart sounds, S1 normal and S2 normal. No murmur heard.    No gallop.  Pulmonary:     Effort: Pulmonary effort is normal. No accessory muscle usage or respiratory distress.     Breath sounds: Normal breath sounds.  Abdominal:     General: Bowel sounds are normal. There is no distension.     Palpations: Abdomen is soft.     Tenderness: There is no abdominal tenderness.  Musculoskeletal:        General: Normal range of motion.     Cervical back: Normal range of motion and neck supple.     Right lower leg: No edema.     Left lower leg: No edema.     Right ankle: No swelling. No tenderness. Normal range of motion. Normal pulse.     Right Achilles Tendon: Normal.     Left ankle: No swelling. No tenderness. Normal range of motion. Normal pulse.     Left Achilles Tendon: Normal.  Skin:    General: Skin is warm and dry.  Neurological:     Mental Status: He is alert and oriented to person, place, and time.     Deep Tendon Reflexes:     Reflex Scores:      Brachioradialis reflexes are 2+ on the right side and 2+ on the left side.      Patellar reflexes are 2+ on the right side and 2+ on the left side. Psychiatric:        Attention and Perception: Attention normal.        Mood and Affect: Mood normal.        Speech: Speech normal.        Behavior: Behavior normal. Behavior is  cooperative.        Thought Content: Thought content normal.    Results for orders placed or performed in visit on 03/14/23  [facility]  Hb A1c Waived   Collection Time: 03/14/23  8:53 AM  Result Value Ref Range   HB A1C (BAYER DCA - WAIVED) 5.3 4.8 - 5.6 %      Assessment & Plan:   Problem List Items Addressed This Visit       Cardiovascular and Mediastinum   Hypertension associated with diabetes (HCC)   Chronic, stable.  BP at goal in office today.  Continue Lisinopril at 10 MG, maintain it for kidney protection with proteinuria.  Urine ALB 16 June 2022.  Recommend continued focus on diet and weight loss at home.  LABS: up to date.  Refills up to date.      Relevant Orders   [facility]ayer DCA Hb A1c Waived (Completed)     Endocrine   Hyperlipidemia associated with type 2 diabetes mellitus (HCC)   Chronic, ongoing, refuses statin.  Is focused on diet and exercise.  Will recheck lipid panel next visit.      Relevant Orders   [facility]ayer DCA Hb A1c Waived (Completed)   Type 2 diabetes mellitus with obesity (HCC) - Primary   Chronic, ongoing with A1c 5.3% today, remaining stable. Urine ALB 16 June 2022.  Initial diagnosis September 2020 with A1c 6.9%.  Tolerating Ozempic well without ADR. - Tolerating Ozempic, continue at 2 MG.  This increase may offer benefit further to weight loss journey and diabetes -- 1 MG wears off before end of week.  Discussed with him consideration of Mounjaro next visit and stopping Ozempic if weight loss stalls out. - Is on ACE for kidney protection, continue this.  No current statin, refuses - Needs eye exam, discussed with patient, referral placed past visit and recommend he schedule.  Foot exam up to date. - Refuses vaccinations - Continue to monitor BS at home occasionally and focus on diet and weight loss.   Return to office in 3 months.      Relevant Orders   Bayer DCA Hb A1c Waived (Completed)     Genitourinary   Balanitis   Acute flares, currently  with one present.  Will place referral for him to return to urology for assessment as has been having recurrent episodes.      Relevant Orders   Ambulatory referral to Urology     Other   Acute bilateral ankle pain   Ongoing for weeks and noticed more when getting up from sitting or getting out of bed.  Suspect some OA present.  Recommend stretching ankles prior to getting out of bed in morning + may use Tylenol as needed or Voltaren gel.  Wear compression to ankles which may off more support.  If ongoing to return to office and will obtain imaging.      BMI 45.0-49.9, adult [facility])   Refer to morbid obesity plan of care.       Morbid obesity (HCC)   BMI 45.12 with T2DM and HTN, has had 44 lbs loss with Ozempic, 18 lbs since recent visit.  Recommended eating smaller high protein, low fat meals more frequently and exercising 30 mins a day 5 times a week with a goal of 10-15lb weight loss in the next 3 months. Patient voiced their understanding and motivation to adhere to these recommendations.           Follow up plan: Return in about 3 months (around 06/12/2023) for T2DM, HTN/HLD.

## 2023-03-14 NOTE — Assessment & Plan Note (Signed)
Chronic, stable.  BP at goal in office today.  Continue Lisinopril at 10 MG, maintain it for kidney protection with proteinuria.  Urine ALB 16 June 2022.  Recommend continued focus on diet and weight loss at home.  LABS: up to date.  Refills up to date.

## 2023-03-14 NOTE — Assessment & Plan Note (Signed)
BMI 45.12 with T2DM and HTN, has had 44 lbs loss with Ozempic, 18 lbs since recent visit.  Recommended eating smaller high protein, low fat meals more frequently and exercising 30 mins a day 5 times a week with a goal of 10-15lb weight loss in the next 3 months. Patient voiced their understanding and motivation to adhere to these recommendations.

## 2023-03-14 NOTE — Assessment & Plan Note (Signed)
Chronic, ongoing with A1c 5.3% today, remaining stable. Urine ALB 16 June 2022.  Initial diagnosis September 2020 with A1c 6.9%.  Tolerating Ozempic well without ADR. - Tolerating Ozempic, continue at 2 MG.  This increase may offer benefit further to weight loss journey and diabetes -- 1 MG wears off before end of week.  Discussed with Brian Thornton consideration of Mounjaro next visit and stopping Ozempic if weight loss stalls out. - Is on ACE for kidney protection, continue this.  No current statin, refuses - Needs eye exam, discussed with patient, referral placed past visit and recommend he schedule.  Foot exam up to date. - Refuses vaccinations - Continue to monitor BS at home occasionally and focus on diet and weight loss.   Return to office in 3 months.

## 2023-03-14 NOTE — Patient Instructions (Signed)

## 2023-03-14 NOTE — Assessment & Plan Note (Signed)
Chronic, ongoing, refuses statin.  Is focused on diet and exercise.  Will recheck lipid panel next visit.

## 2023-03-27 ENCOUNTER — Ambulatory Visit
Admission: EM | Admit: 2023-03-27 | Discharge: 2023-03-27 | Disposition: A | Discharge status: Home / Self Care | Payer: BC Managed Care – PPO | Attending: Physician Assistant | Admitting: Physician Assistant

## 2023-03-27 DIAGNOSIS — R109 Unspecified abdominal pain: Secondary | ICD-10-CM

## 2023-03-27 DIAGNOSIS — X501XXA Overexertion from prolonged static or awkward postures, initial encounter: Secondary | ICD-10-CM | POA: Diagnosis not present

## 2023-03-27 DIAGNOSIS — M545 Low back pain, unspecified: Secondary | ICD-10-CM | POA: Diagnosis not present

## 2023-03-27 DIAGNOSIS — S39012A Strain of muscle, fascia and tendon of lower back, initial encounter: Secondary | ICD-10-CM | POA: Diagnosis not present

## 2023-03-27 LAB — URINALYSIS, W/ REFLEX TO CULTURE (INFECTION SUSPECTED)
Bilirubin Urine: NEGATIVE
Glucose, UA: NEGATIVE mg/dL
Hgb urine dipstick: NEGATIVE
Ketones, ur: NEGATIVE mg/dL
Leukocytes,Ua: NEGATIVE
Nitrite: NEGATIVE
Protein, ur: NEGATIVE mg/dL
RBC / HPF: NONE SEEN RBC/hpf (ref 0–5)
Specific Gravity, Urine: 1.02 (ref 1.005–1.030)
pH: 6.5 (ref 5.0–8.0)

## 2023-03-27 MED ORDER — BACLOFEN 10 MG PO TABS: 10.0000 mg | ORAL_TABLET | Freq: Three times a day (TID) | ORAL | 0 refills | Status: DC | PRN

## 2023-03-27 MED ORDER — NAPROXEN 500 MG PO TABS: 500.0000 mg | ORAL_TABLET | Freq: Two times a day (BID) | ORAL | 0 refills | Status: DC | PRN

## 2023-03-27 NOTE — Discharge Instructions (Addendum)
-  The urine looks clear today.  No evidence of infection. - There was also no blood, protein or crystals in there which could indicate possible kidney stone but it is not 100% rule out so if your discomfort continues please follow-up with your primary care provider. - Sent an anti-inflammatory medication for discomfort.  You can also take Tylenol .  Sent a muscle relaxer. - Use heat, muscle rubs and stretch.  BACK PAIN RED FLAGS: If the back pain acutely worsens or there are any red flag symptoms such as numbness/tingling, leg weakness, saddle anesthesia, or loss of bowel/bladder control, go immediately to the ER. Follow up with us  as scheduled or sooner if the pain does not begin to resolve or if it worsens before the follow up

## 2023-03-27 NOTE — ED Provider Notes (Signed)
 [facility]    CSN: [id number] Arrival date & time: 03/27/23  0853      History   Chief Complaint Chief Complaint  Patient presents with   Flank Pain    HPI Brian Thornton is a 43 y.o. male with history of obesity, hypertension, hyperlipidemia, gout and type 2 diabetes.  Patient presents today for 2-day history of intermittent left sided flank/thoracolumbar back pain.  He reports a constant mild ache but has occasional sharp pains.  Pain does not radiate.  No associated leg discomfort, numbness/tingling or weakness.  No abdominal pain.  Has had a little bit of nausea without vomiting.  No fevers, dysuria, frequency, urgency, hematuria.  Movement does not make the pain worse.  Sitting or walking does not increase back discomfort.  Back discomfort is a little better today than it was yesterday.  Yesterday he reports taking Tylenol  and amoxicillin .  No of urinary tract infections, pyelonephritis or kidney stones.  He does report that he has had protein in his urine and states that he was on indomethacin  last week for gout flareup.  No other complaints.  HPI  Past Medical History:  Diagnosis Date   Dysmetabolic syndrome    Extreme obesity    Gout    Hyperlipidemia    Hypertension    IFG (impaired fasting glucose)     Patient Active Problem List   Diagnosis Date Noted   Balanitis 03/14/2023   Acute bilateral ankle pain 03/14/2023   Personal history of gout 09/09/2022   BMI 45.0-49.9, adult (HCC) 11/03/2018   Eczema 07/09/2016   Family history of colon cancer 02/21/2015   Hypertension associated with diabetes (HCC) 09/24/2014   Hyperlipidemia associated with type 2 diabetes mellitus (HCC) 09/24/2014   Type 2 diabetes mellitus with obesity (HCC) 09/24/2014   Morbid obesity (HCC) 09/24/2014    Past Surgical History:  Procedure Laterality Date   ORIF ANKLE FRACTURE Left 11/03/2018   Procedure: LEFT ANKLE STABILIZATION;  Surgeon: Edie Norleen PARAS, MD;  Location: [facility]  ORS;  Service: Orthopedics;  Laterality: Left;       Home Medications    Prior to Admission medications   Medication Sig Start Date End Date Taking? Authorizing Provider  baclofen  (LIORESAL ) 10 MG tablet Take 1 tablet (10 mg total) by mouth 3 (three) times daily as needed for muscle spasms. 03/27/23  Yes Arvis Jolan NOVAK, PA-C  cetirizine (ZYRTEC) 10 MG tablet Take 10 mg by mouth daily.   Yes [provider]  lisinopril  (ZESTRIL ) 10 MG tablet Take 1 tablet (10 mg total) by mouth daily. 06/12/22  Yes Cannady, Jolene T, NP  naproxen  (NAPROSYN ) 500 MG tablet Take 1 tablet (500 mg total) by mouth 2 (two) times daily as needed for moderate pain (pain score 4-6). 03/27/23  Yes Arvis Jolan B, PA-C  Semaglutide , 2 MG/DOSE, 8 MG/3ML SOPN Inject 2 mg as directed once a week. 12/12/22  Yes Cannady, Jolene T, NP    Family History Family History  Problem Relation Age of Onset   Diabetes Father    Gout Brother    Heart attack Maternal Grandmother    Cancer Maternal Grandfather        colon   Cancer Paternal Grandfather        prostate    Social History Social History   Tobacco Use   Smoking status: Never   Smokeless tobacco: Never  Vaping Use   Vaping status: Never Used  Substance Use Topics   Alcohol use:  No    Alcohol/week: 0.0 standard drinks of alcohol   Drug use: No     Allergies   Febuxostat   Review of Systems Review of Systems  Constitutional:  Negative for fatigue and fever.  Gastrointestinal:  Positive for nausea. Negative for abdominal pain and vomiting.  Genitourinary:  Positive for flank pain. Negative for decreased urine volume, difficulty urinating, dysuria, frequency, genital sores, hematuria, penile discharge, penile pain, penile swelling, scrotal swelling, testicular pain and urgency.  Musculoskeletal:  Positive for back pain. Negative for arthralgias.  Skin:  Negative for rash.  Neurological:  Negative for weakness.     Physical Exam Triage Vital  Signs ED Triage Vitals  Encounter Vitals Group     BP 03/27/23 0907 (!) 141/108     Systolic BP Percentile --      Diastolic BP Percentile --      Pulse Rate 03/27/23 0907 73     Resp 03/27/23 0907 18     Temp 03/27/23 0911 97.7 F (36.5 C)     Temp Source 03/27/23 0911 Oral     SpO2 03/27/23 0907 97 %     Weight --      Height --      Head Circumference --      Peak Flow --      Pain Score 03/27/23 0901 6     Pain Loc --      Pain Education --      Exclude from Growth Chart --    No data found.  Updated Vital Signs BP (!) 141/108 (BP Location: Left Arm)   Pulse 73   Temp 97.7 F (36.5 C) (Oral)   Resp 18   SpO2 97%      Physical Exam Vitals and nursing note reviewed.  Constitutional:      General: He is not in acute distress.    Appearance: Normal appearance. He is well-developed. He is obese. He is not ill-appearing.  HENT:     Head: Normocephalic and atraumatic.  Eyes:     General: No scleral icterus.    Conjunctiva/sclera: Conjunctivae normal.  Cardiovascular:     Rate and Rhythm: Normal rate and regular rhythm.     Heart sounds: Normal heart sounds.  Pulmonary:     Effort: Pulmonary effort is normal. No respiratory distress.     Breath sounds: Normal breath sounds.  Abdominal:     Palpations: Abdomen is soft.     Tenderness: There is no abdominal tenderness. There is no right CVA tenderness or left CVA tenderness.  Musculoskeletal:     Cervical back: Neck supple.  Skin:    General: Skin is warm and dry.     Capillary Refill: Capillary refill takes less than 2 seconds.  Neurological:     General: No focal deficit present.     Mental Status: He is alert. Mental status is at baseline.     Motor: No weakness.     Gait: Gait normal.  Psychiatric:        Mood and Affect: Mood normal.        Behavior: Behavior normal.      UC Treatments / Results  Labs (all labs ordered are listed, but only abnormal results are displayed) Labs Reviewed   URINALYSIS, W/ REFLEX TO CULTURE (INFECTION SUSPECTED) - Abnormal; Notable for the following components:      Result Value   Bacteria, UA RARE (*)    All other components within normal limits  EKG   Radiology No results found.  Procedures Procedures (including critical care time)  Medications Ordered in UC Medications - No data to display  Initial Impression / Assessment and Plan / UC Course  I have reviewed the triage vital signs and the nursing notes.  Pertinent labs & imaging results that were available during my care of the patient were reviewed by me and considered in my medical decision making (see chart for details).   43 year old male with history of hypertension, hyperlipidemia, obesity, gout, type 2 diabetes presents for 2-day history of intermittent sharp left flank/thoracolumbar back pains.  No associated urinary symptoms.  Mild associated nausea but not at this time.  No fever, vomiting, pain into legs, numbness/tingling or weakness.  No injury to back.  No history of UTIs or kidney stones.  BP is a bit elevated at 141/108.  He is overall well-appearing.  Chest clear auscultation heart regular rate and rhythm.  Abdomen soft and nontender.  No CVA tenderness or tenderness of back.  Full range of motion of back without discomfort.  Will obtain urinalysis to assess for possible urinary tract infection or signs of possible kidney stones.  UA is within normal limits. Reviewed results with patient. Low suspicion for UTI or kidney stone.   Likely musculoskeletal back pain. Sent naproxen  and baclofen  to pharmacy. Discussed use of heating pad, Tylenol , stretching. Reviewed return and PCP follow up. Reviewed ED follow up in handout.   Final Clinical Impressions(s) / UC Diagnoses   Final diagnoses:  Acute left-sided low back pain without sciatica  Left flank pain     Discharge Instructions      -The urine looks clear today.  No evidence of infection. - There was  also no blood, protein or crystals in there which could indicate possible kidney stone but it is not 100% rule out so if your discomfort continues please follow-up with your primary care provider. - Sent an anti-inflammatory medication for discomfort.  You can also take Tylenol .  Sent a muscle relaxer. - Use heat, muscle rubs and stretch.  BACK PAIN RED FLAGS: If the back pain acutely worsens or there are any red flag symptoms such as numbness/tingling, leg weakness, saddle anesthesia, or loss of bowel/bladder control, go immediately to the ER. Follow up with us  as scheduled or sooner if the pain does not begin to resolve or if it worsens before the follow up       ED Prescriptions     Medication Sig Dispense Auth. Provider   baclofen  (LIORESAL ) 10 MG tablet Take 1 tablet (10 mg total) by mouth 3 (three) times daily as needed for muscle spasms. 20 each Arvis Jolan NOVAK, PA-C   naproxen  (NAPROSYN ) 500 MG tablet Take 1 tablet (500 mg total) by mouth 2 (two) times daily as needed for moderate pain (pain score 4-6). 20 tablet Noha Milberger B, PA-C      PDMP not reviewed this encounter.   Arvis Jolan NOVAK, PA-C 03/27/23 1042

## 2023-03-27 NOTE — ED Triage Notes (Signed)
 Pt reports intermittent sharp pain in L flank x 2 days.  No other s/s - denies dysuria, malodorous urine, fever, etc.  May be worse with activity. Typically relieved by laying on his back. Took Amoxicillin yesterday that he had laying around.

## 2023-04-01 ENCOUNTER — Encounter: Payer: Self-pay | Admitting: Nurse Practitioner

## 2023-04-10 DIAGNOSIS — E119 Type 2 diabetes mellitus without complications: Secondary | ICD-10-CM | POA: Diagnosis not present

## 2023-04-10 LAB — HM DIABETES EYE EXAM

## 2023-04-16 ENCOUNTER — Ambulatory Visit: Payer: BC Managed Care – PPO | Admitting: Urology

## 2023-04-16 ENCOUNTER — Encounter: Payer: Self-pay | Admitting: Urology

## 2023-04-16 VITALS — BP 135/90 | HR 80 | Ht 73.0 in | Wt 353.0 lb

## 2023-04-16 DIAGNOSIS — N529 Male erectile dysfunction, unspecified: Secondary | ICD-10-CM | POA: Diagnosis not present

## 2023-04-16 DIAGNOSIS — N480 Leukoplakia of penis: Secondary | ICD-10-CM

## 2023-04-16 MED ORDER — NYSTATIN-TRIAMCINOLONE 100000-0.1 UNIT/GM-% EX OINT: 1.0000 | TOPICAL_OINTMENT | Freq: Two times a day (BID) | CUTANEOUS | 0 refills | Status: DC

## 2023-04-16 MED ORDER — TADALAFIL 5 MG PO TABS: 5.0000 mg | ORAL_TABLET | Freq: Every day | ORAL | 11 refills | Status: AC | PRN

## 2023-04-16 NOTE — Progress Notes (Signed)
   04/16/23 3:26 PM   Brian Thornton [birth year] [id number]  CC: Penile concerns, ED  HPI: 43 year old male with morbid obesity and BMI of 47 who I saw previously in 2020 for phimosis/balanitis.  His symptoms improved with a course of Mycolog cream at that time, and urinary symptoms also improved by avoiding diet drinks.  He is having some problems with erections, as well as some problems with a tight ring around the mid shaft of the penis.   PMH: Past Medical History:  Diagnosis Date   Dysmetabolic syndrome    Extreme obesity    Gout    Hyperlipidemia    Hypertension    IFG (impaired fasting glucose)     Surgical History: Past Surgical History:  Procedure Laterality Date   ORIF ANKLE FRACTURE Left 11/03/2018   Procedure: LEFT ANKLE STABILIZATION;  Surgeon: Christena Flake, MD;  Location: [facility] ORS;  Service: Orthopedics;  Laterality: Left;    Family History: Family History  Problem Relation Age of Onset   Diabetes Father    Gout Brother    Heart attack Maternal Grandmother    Cancer Maternal Grandfather        colon   Cancer Paternal Grandfather        prostate    Social History:  reports that he has never smoked. He has never been exposed to tobacco smoke. He has never used smokeless tobacco. He reports that he does not drink alcohol and does not use drugs.  Physical Exam: BP (!) 135/90   Pulse 80   Ht 6\' 1"  (1.854 m)   Wt (!) 353 lb (160.1 kg)   BMI 46.57 kg/m    Constitutional:  Alert and oriented, No acute distress. Cardiovascular: No clubbing, cyanosis, or edema. Respiratory: Normal respiratory effort, no increased work of breathing. 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  Weight loss discussed.  Think is reasonable to trial a course of Mycolog cream for his penile symptoms, as well as Cialis for ED.  Risk and benefits were discussed, he prefers follow-up as needed   Legrand Rams, MD 04/16/2023  [facility] [address], [address] [place], [place] [address] [phone]

## 2023-05-09 ENCOUNTER — Encounter: Payer: Self-pay | Admitting: Internal Medicine

## 2023-05-09 ENCOUNTER — Ambulatory Visit: Payer: BC Managed Care – PPO | Admitting: Internal Medicine

## 2023-05-09 VITALS — BP 132/84 | HR 85 | Ht 73.0 in | Wt 355.5 lb

## 2023-05-09 DIAGNOSIS — Z024 Encounter for examination for driving license: Secondary | ICD-10-CM

## 2023-05-09 NOTE — Progress Notes (Signed)
 Commercial Driver Medical Examination   Brian Thornton is a 43 y.o. male who presents today for a commercial driver fitness determination physical exam. The patient reports no problems. The following portions of the patient's history were reviewed and updated as appropriate: allergies, current medications, past family history, past medical history, past social history, past surgical history, and problem list. Review of Systems  Past Medical History:  Diagnosis Date   Dysmetabolic syndrome    Extreme obesity    Gout    Hyperlipidemia    Hypertension    IFG (impaired fasting glucose)     Current Outpatient Medications  Medication Sig Dispense Refill   cetirizine (ZYRTEC) 10 MG tablet Take 10 mg by mouth daily.     lisinopril (ZESTRIL) 10 MG tablet Take 1 tablet (10 mg total) by mouth daily. 90 tablet 4   nystatin-triamcinolone ointment (MYCOLOG) Apply 1 Application topically 2 (two) times daily. 30 g 0   Semaglutide, 2 MG/DOSE, 8 MG/3ML SOPN Inject 2 mg as directed once a week. 3 mL 12   tadalafil (CIALIS) 5 MG tablet Take 1 tablet (5 mg total) by mouth daily as needed for erectile dysfunction (take 45 minutes prior to sexual activity). 30 tablet 11   No current facility-administered medications for this visit.    Allergies  Allergen Reactions   Febuxostat Other (See Comments)    Caused more flares    Family History  Problem Relation Age of Onset   Diabetes Father    Gout Brother    Heart attack Maternal Grandmother    Cancer Maternal Grandfather        colon   Cancer Paternal Grandfather        prostate    Social History   Socioeconomic History   Marital status: Married    Spouse name: Not on file   Number of children: Not on file   Years of education: Not on file   Highest education level: 12th grade  Occupational History   Not on file  Tobacco Use   Smoking status: Never    Passive exposure: Never   Smokeless tobacco: Never  Vaping Use   Vaping status:  Never Used  Substance and Sexual Activity   Alcohol use: No    Alcohol/week: 0.0 standard drinks of alcohol   Drug use: No   Sexual activity: Yes  Other Topics Concern   Not on file  Social History Narrative   Not on file   Social Drivers of Health   Financial Resource Strain: Low Risk  (06/11/2022)   Overall Financial Resource Strain (CARDIA)    Difficulty of Paying Living Expenses: Not very hard  Food Insecurity: No Food Insecurity (06/11/2022)   Hunger Vital Sign    Worried About Running Out of Food in the Last Year: Never true    Ran Out of Food in the Last Year: Never true  Transportation Needs: No Transportation Needs (06/11/2022)   PRAPARE - Administrator, Civil Service (Medical): No    Lack of Transportation (Non-Medical): No  Physical Activity: Unknown (06/11/2022)   Exercise Vital Sign    Days of Exercise per Week: 0 days    Minutes of Exercise per Session: Not on file  Stress: No Stress Concern Present (06/11/2022)   [employer] of Occupational Health - Occupational Stress Questionnaire    Feeling of Stress : Only a little  Social Connections: Moderately Isolated (06/11/2022)   Social Connection and Isolation Panel [NHANES]    Frequency  of Communication with Friends and Family: More than three times a week    Frequency of Social Gatherings with Friends and Family: Once a week    Attends Religious Services: Never    Database administrator or Organizations: No    Attends Engineer, structural: Not on file    Marital Status: Married  Catering manager Violence: Not on file     Constitutional: Denies fever, malaise, fatigue, headache or abrupt weight changes.  HEENT: Denies eye pain, eye redness, ear pain, ringing in the ears, wax buildup, runny nose, nasal congestion, bloody nose, or sore throat. Respiratory: Denies difficulty breathing, shortness of breath, cough or sputum production.   Cardiovascular: Denies chest pain, chest tightness,  palpitations or swelling in the hands or feet.  Gastrointestinal: Denies abdominal pain, bloating, constipation, diarrhea or blood in the stool.  GU: Denies urgency, frequency, pain with urination, burning sensation, blood in urine, odor or discharge. Musculoskeletal: Denies decrease in range of motion, difficulty with gait, muscle pain or joint pain and swelling.  Skin: Denies redness, rashes, lesions or ulcercations.  Neurological: Denies dizziness, difficulty with memory, difficulty with speech or problems with balance and coordination.  Psych: Denies anxiety, depression, SI/HI.  No other specific complaints in a complete review of systems (except as listed in HPI above).   Objective:    Vision:  Uncorrected Corrected Horizontal Field of Vision  Right Eye 20/20  70 degrees  Left Eye  20/20  70 degrees  Both Eyes  20/20     Applicant canrecognize and distinguish among traffic control signals and devices showing standard red, green, and amber colors.     Monocular Vision?: no   Hearing:        Right Ear  > 72ft     Left Ear  > 44ft        BP 132/84 (BP Location: Left Arm, Patient Position: Sitting, Cuff Size: Large)   Ht 6\' 1"  (1.854 m)   Wt (!) 355 lb 8 oz (161.3 kg)   BMI 46.90 kg/m   Wt Readings from Last 3 Encounters:  04/16/23 (!) 353 lb (160.1 kg)  03/14/23 (!) 344 lb 12.8 oz (156.4 kg)  12/12/22 (!) 362 lb 9.6 oz (164.5 kg)    General: Appears his stated age, obese, in NAD. Skin: Warm, dry and intact. No rashes, lesions or ulcerations noted. HEENT: Head: normal shape and size; Eyes: sclera white, no icterus, conjunctiva pink, PERRLA and EOMs intact; Ears: Tm's gray and intact, normal light reflex; Nose: mucosa pink and moist, septum midline; Throat/Mouth: Teeth present, mucosa pink and moist, no exudate, lesions or ulcerations noted.  Neck:  Neck supple, trachea midline. No masses, lumps or thyromegaly present.  Cardiovascular: Normal rate and rhythm. S1,S2  noted.  No murmur, rubs or gallops noted. No JVD or BLE edema. No carotid bruits noted. Pulmonary/Chest: Normal effort and positive vesicular breath sounds. No respiratory distress. No wheezes, rales or ronchi noted.  Abdomen: Soft and nontender. Normal bowel sounds. No distention or masses noted. Liver, spleen and kidneys non palpable. Musculoskeletal: Normal range of motion. Strength 5/5 BUE/BLE. No signs of joint swelling. No difficulty with gait.  Neurological: Alert and oriented. Cranial nerves II-XII grossly intact. Coordination normal.  Psychiatric: Mood and affect normal. Behavior is normal. Judgment and thought content normal.    BMET    Component Value Date/Time   NA 141 12/12/2022 1002   K 4.2 12/12/2022 1002   CL 104 12/12/2022 1002  CO2 22 12/12/2022 1002   GLUCOSE 86 12/12/2022 1002   BUN 13 12/12/2022 1002   CREATININE 1.04 12/12/2022 1002   CALCIUM 9.4 12/12/2022 1002   GFRNONAA 79 01/01/2020 1531   GFRAA 92 01/01/2020 1531    Lipid Panel     Component Value Date/Time   CHOL 168 12/12/2022 1002   TRIG 145 12/12/2022 1002   HDL 37 (L) 12/12/2022 1002   LDLCALC 105 (H) 12/12/2022 1002    CBC    Component Value Date/Time   WBC 6.0 08/04/2021 0854   RBC 6.01 (H) 08/04/2021 0854   HGB 17.2 08/04/2021 0854   HCT 51.1 (H) 08/04/2021 0854   PLT 179 08/04/2021 0854   MCV 85 08/04/2021 0854   MCH 28.6 08/04/2021 0854   MCHC 33.7 08/04/2021 0854   RDW 14.1 08/04/2021 0854   LYMPHSABS 1.9 08/04/2021 0854   EOSABS 0.3 08/04/2021 0854   BASOSABS 0.0 08/04/2021 0854    Hgb A1C Lab Results  Component Value Date   HGBA1C 5.3 03/14/2023        Labs:   Urinalysis:   Specific gravity: 1.050   Blood: Negative   Protein: Negative   Glucose Negative    Assessment:    Healthy male exam.  Meets standards, but periodic monitoring required due to HTN, DM 2.  Driver qualified only for 1 year.    Plan:    Medical examiners certificate completed and  printed. Return as needed.  Nicki Reaper, NP

## 2023-06-09 NOTE — Patient Instructions (Signed)
 Be Involved in Caring For Your Health:  Taking Medications When medications are taken as directed, they can greatly improve your health. But if they are not taken as prescribed, they may not work. In some cases, not taking them correctly can be harmful. To help ensure your treatment remains effective and safe, understand your medications and how to take them. Bring your medications to each visit for review by your provider.  Your lab results, notes, and after visit summary will be available on My Chart. We strongly encourage you to use this feature. If lab results are abnormal the clinic will contact you with the appropriate steps. If the clinic does not contact you assume the results are satisfactory. You can always view your results on My Chart. If you have questions regarding your health or results, please contact the clinic during office hours. You can also ask questions on My Chart.  We at [facility] are grateful that you chose [place] to provide your care. We strive to provide evidence-based and compassionate care and are always looking for feedback. If you get a survey from the clinic please complete this so we can hear your opinions.  Diabetes Mellitus and Foot Care Diabetes, also called diabetes mellitus, may cause problems with your feet and legs because of poor blood flow (circulation). Poor circulation may make your skin: Become thinner and drier. Break more easily. Heal more slowly. Peel and crack. You may also have nerve damage (neuropathy). This can cause decreased feeling in your legs and feet. This means that you may not notice minor injuries to your feet that could lead to more serious problems. Finding and treating problems early is the best way to prevent future foot problems. How to care for your feet Foot hygiene  Wash your feet daily with warm water and mild soap. Do not use hot water. Then, pat your feet and the areas between your toes until they are fully dry. Do  not soak your feet. This can dry your skin. Trim your toenails straight across. Do not dig under them or around the cuticle. File the edges of your nails with an emery board or nail file. Apply a moisturizing lotion or petroleum jelly to the skin on your feet and to dry, brittle toenails. Use lotion that does not contain alcohol and is unscented. Do not apply lotion between your toes. Shoes and socks Wear clean socks or stockings every day. Make sure they are not too tight. Do not wear knee-high stockings. These may decrease blood flow to your legs. Wear shoes that fit well and have enough cushioning. Always look in your shoes before you put them on to be sure there are no objects inside. To break in new shoes, wear them for just a few hours a day. This prevents injuries on your feet. Wounds, scrapes, corns, and calluses  Check your feet daily for blisters, cuts, bruises, sores, and redness. If you cannot see the bottom of your feet, use a mirror or ask someone for help. Do not cut off corns or calluses or try to remove them with medicine. If you find a minor scrape, cut, or break in the skin on your feet, keep it and the skin around it clean and dry. You may clean these areas with mild soap and water. Do not clean the area with peroxide, alcohol, or iodine. If you have a wound, scrape, corn, or callus on your foot, look at it several times a day to make sure it  is healing and not infected. Check for: Redness, swelling, or pain. Fluid or blood. Warmth. Pus or a bad smell. General tips Do not cross your legs. This may decrease blood flow to your feet. Do not use heating pads or hot water bottles on your feet. They may burn your skin. If you have lost feeling in your feet or legs, you may not know this is happening until it is too late. Protect your feet from hot and cold by wearing shoes, such as at the beach or on hot pavement. Schedule a complete foot exam at least once a year or more often if  you have foot problems. Report any cuts, sores, or bruises to your health care provider right away. Where to find more information American Diabetes Association: diabetes.org Association of Diabetes Care & Education Specialists: diabeteseducator.org Contact a health care provider if: You have a condition that increases your risk of infection, and you have any cuts, sores, or bruises on your feet. You have an injury that is not healing. You have redness on your legs or feet. You feel burning or tingling in your legs or feet. You have pain or cramps in your legs and feet. Your legs or feet are numb. Your feet always feel cold. You have pain around any toenails. Get help right away if: You have a wound, scrape, corn, or callus on your foot and: You have signs of infection. You have a fever. You have a red line going up your leg. This information is not intended to replace advice given to you by your health care provider. Make sure you discuss any questions you have with your health care provider. Document Revised: 09/06/2021 Document Reviewed: 09/06/2021 Elsevier Patient Education  2024 [employer].

## 2023-06-13 ENCOUNTER — Telehealth: Payer: Self-pay

## 2023-06-13 ENCOUNTER — Ambulatory Visit: Payer: Self-pay | Admitting: Nurse Practitioner

## 2023-06-13 ENCOUNTER — Encounter: Payer: Self-pay | Admitting: Nurse Practitioner

## 2023-06-13 VITALS — BP 126/80 | HR 76 | Temp 97.5°F | Resp 18 | Ht 73.0 in | Wt 368.0 lb

## 2023-06-13 DIAGNOSIS — E785 Hyperlipidemia, unspecified: Secondary | ICD-10-CM | POA: Diagnosis not present

## 2023-06-13 DIAGNOSIS — E1159 Type 2 diabetes mellitus with other circulatory complications: Secondary | ICD-10-CM

## 2023-06-13 DIAGNOSIS — E669 Obesity, unspecified: Secondary | ICD-10-CM | POA: Diagnosis not present

## 2023-06-13 DIAGNOSIS — I152 Hypertension secondary to endocrine disorders: Secondary | ICD-10-CM | POA: Diagnosis not present

## 2023-06-13 DIAGNOSIS — E1169 Type 2 diabetes mellitus with other specified complication: Secondary | ICD-10-CM

## 2023-06-13 DIAGNOSIS — Z6841 Body Mass Index (BMI) 40.0 and over, adult: Secondary | ICD-10-CM | POA: Diagnosis not present

## 2023-06-13 LAB — MICROALBUMIN, URINE WAIVED
Creatinine, Urine Waived: 200 mg/dL (ref 10–300)
Microalb, Ur Waived: 150 mg/L — ABNORMAL HIGH (ref 0–19)

## 2023-06-13 LAB — BAYER DCA HB A1C WAIVED: HB A1C (BAYER DCA - WAIVED): 5.6 % (ref 4.8–5.6)

## 2023-06-13 NOTE — Assessment & Plan Note (Signed)
 Refer to morbid obesity plan of care.

## 2023-06-13 NOTE — Progress Notes (Signed)
 Care Guide Pharmacy Note  06/13/2023 Name: Brian Thornton MRN: [id number] DOB: 1980-09-19  Referred By: Marjie Skiff, NP Reason for referral: Care Coordination (Outreach to schedule with Pharm d )   Brian Thornton is a 43 y.o. year old male who is a primary care patient of Cannady, Dorie Rank, NP.  Brian Thornton was referred to the pharmacist for assistance related to: DMII  Successful contact was made with the patient to discuss pharmacy services including being ready for the pharmacist to call at least 5 minutes before the scheduled appointment time and to have medication bottles and any blood pressure readings ready for review. The patient agreed to meet with the pharmacist via telephone visit on (date/time).06/19/2023  Penne Lash , RMA     [employer]  [facility] Guide  Direct Dial: [phone]  Website: [email or web address]

## 2023-06-13 NOTE — Assessment & Plan Note (Signed)
 Chronic, stable.  BP at goal in office today.  Continue Lisinopril at 10 MG, maintain it for kidney protection with proteinuria.  Urine ALB 16 June 2022, recheck today.  Recommend continued focus on diet and weight loss at home.  LABS: CMP, urine ALB.  Refills up to date.

## 2023-06-13 NOTE — Assessment & Plan Note (Signed)
 Chronic, ongoing. Initial diagnosis September 2020 with A1c 6.9%.  A1c 5.3% in December while on Ozempic, but now has been off of this for 2 1/2 months and gained 15 pounds.  Will recheck A1c and urine ALB today.  Urine ALB 16 June 2022.  Tolerated Ozempic well with 44 lbs lost -- was on 2 MG. - Referral to [facility] pharmacist to see if any assistance available to help with cost for either Ozempic of Mounjaro.  Currently Ozempic will cost $200 a month. - Is on ACE for kidney protection, continue this.  No current statin, refuses. - Eye and foot exams up to date. - Refuses vaccinations - Continue to monitor BS at home occasionally and focus on diet and weight loss.   Return to office in 3 months, sooner if able to adjust medications or get back on board.

## 2023-06-13 NOTE — Assessment & Plan Note (Signed)
 BMI 45.55 with T2DM and HTN.  Cannot afford Ozempic at present and has gained 15 lbs, had lost 44 lbs with Ozempic.  Recommended eating smaller high protein, low fat meals more frequently and exercising 30 mins a day 5 times a week with a goal of 10-15lb weight loss in the next 3 months. Patient voiced their understanding and motivation to adhere to these recommendations.

## 2023-06-13 NOTE — Progress Notes (Addendum)
 BP 126/80 (BP Location: Left Arm, Patient Position: Sitting, Cuff Size: Large)   Pulse 76   Temp (!) 97.5 F (36.4 C) (Oral)   Resp 18   Ht 6\' 1"  (1.854 m)   Wt (!) 368 lb (166.9 kg)   SpO2 96%   BMI 48.55 kg/m    Subjective:    Patient ID: Brian Thornton, male    DOB: 1980-04-16, 43 y.o.   MRN: [id number]  HPI: Brian Thornton is a 43 y.o. male  Chief Complaint  Patient presents with   Hypertension   Hyperlipidemia   Diabetes   DIABETES December A1c 5.3%.  No current medications. Did take Ozempic for period.  Had lost 44 pounds with this. Not taking Ozempic due to cost -- $200 a month.  Does endorse he has been eating more than he was. Has gained 15 lbs without this on board.  Has been off of this since January. Hypoglycemic episodes:no Polydipsia/polyuria: no  Visual disturbance: no Chest pain: no Paresthesias: no Glucose Monitoring: no  Accucheck frequency: Not Checking  Fasting glucose:  Post prandial:  Evening:  Before meals: Taking Insulin?: no  Long acting insulin:  Short acting insulin: Blood Pressure Monitoring: not checking Retinal Examination: Up To Date -- [facility] Foot Exam: Up to Date Pneumovax:  refused Influenza:  refused Aspirin: no   HYPERTENSION  Taking Lisinopril 10 MG daily. Wishes not to take statin therapy at this time. Hypertension status: stable  Satisfied with current treatment? yes Duration of hypertension: chronic BP monitoring frequency:  not checking BP range:  BP medication side effects:  no Medication compliance: good compliance Aspirin: no Recurrent headaches: no Visual changes: no Palpitations: no Dyspnea: no Chest pain: no Lower extremity edema: occasional at end of day Dizzy/lightheaded: no  The 10-year ASCVD risk score (Arnett DK, et al., 2019) is: 3%   Values used to calculate the score:     Age: 89 years     Sex: Male     Is Non-Hispanic African American: No     Diabetic: Yes     Tobacco smoker: No      Systolic Blood Pressure: 126 mmHg     Is BP treated: Yes     HDL Cholesterol: 37 mg/dL     Total Cholesterol: 168 mg/dL      4/69/6295    2:84 AM 03/14/2023    8:51 AM 12/12/2022    9:54 AM 09/10/2022    3:25 PM 06/12/2022   11:23 AM  Depression screen PHQ 2/9  Decreased Interest 0 0 0 0 1  Down, Depressed, Hopeless 0 0 0 0 0  PHQ - 2 Score 0 0 0 0 1  Altered sleeping 0 0 0 0 1  Tired, decreased energy 0 1 3 1 1   Change in appetite 3 0 1 0 3  Feeling bad or failure about yourself  0 0 0 0 0  Trouble concentrating 0 0 0 0 1  Moving slowly or fidgety/restless 0 0 0 0 0  Suicidal thoughts 0 0 0 0 0  PHQ-9 Score 3 1 4 1 7   Difficult doing work/chores Not difficult at all Not difficult at all Somewhat difficult Not difficult at all Not difficult at all       06/13/2023    8:25 AM 03/14/2023    8:52 AM 12/12/2022    9:54 AM 09/10/2022    3:25 PM  GAD 7 : Generalized Anxiety Score  Nervous, Anxious, on Edge  0 0 0 0  Control/stop worrying 0 0 0 0  Worry too much - different things 0 0 0 0  Trouble relaxing 0 0 0 0  Restless 0 0 0 0  Easily annoyed or irritable 0 0 0 0  Afraid - awful might happen 0 0 0 0  Total GAD 7 Score 0 0 0 0  Anxiety Difficulty Not difficult at all Not difficult at all Not difficult at all Not difficult at all     Relevant past medical, surgical, family and social history reviewed and updated as indicated. Interim medical history since our last visit reviewed. Allergies and medications reviewed and updated.  Review of Systems  Constitutional:  Negative for activity change, diaphoresis, fatigue and fever.  Respiratory:  Negative for cough, chest tightness, shortness of breath and wheezing.   Cardiovascular:  Negative for chest pain, palpitations and leg swelling.  Gastrointestinal: Negative.   Endocrine: Negative for polydipsia, polyphagia and polyuria.  Neurological: Negative.   Psychiatric/Behavioral: Negative.     Per HPI unless specifically  indicated above     Objective:    BP 126/80 (BP Location: Left Arm, Patient Position: Sitting, Cuff Size: Large)   Pulse 76   Temp (!) 97.5 F (36.4 C) (Oral)   Resp 18   Ht 6\' 1"  (1.854 m)   Wt (!) 368 lb (166.9 kg)   SpO2 96%   BMI 48.55 kg/m   Wt Readings from Last 3 Encounters:  06/13/23 (!) 368 lb (166.9 kg)  05/09/23 (!) 355 lb 8 oz (161.3 kg)  04/16/23 (!) 353 lb (160.1 kg)    Physical Exam Vitals and nursing note reviewed.  Constitutional:      General: He is awake. He is not in acute distress.    Appearance: Normal appearance. He is well-developed and well-groomed. He is obese. He is not ill-appearing or toxic-appearing.  HENT:     Head: Normocephalic and atraumatic.     Right Ear: Hearing normal. No drainage.     Left Ear: Hearing normal. No drainage.  Eyes:     General: Lids are normal.        Right eye: No discharge.        Left eye: No discharge.     Conjunctiva/sclera: Conjunctivae normal.     Pupils: Pupils are equal, round, and reactive to light.  Neck:     Vascular: No carotid bruit.  Cardiovascular:     Rate and Rhythm: Normal rate and regular rhythm.     Heart sounds: Normal heart sounds, S1 normal and S2 normal. No murmur heard.    No gallop.  Pulmonary:     Effort: Pulmonary effort is normal. No accessory muscle usage or respiratory distress.     Breath sounds: Normal breath sounds.  Abdominal:     General: Bowel sounds are normal. There is no distension.     Palpations: Abdomen is soft.     Tenderness: There is no abdominal tenderness.  Musculoskeletal:        General: Normal range of motion.     Cervical back: Normal range of motion and neck supple.     Right lower leg: No edema.     Left lower leg: No edema.     Right ankle: No swelling. No tenderness. Normal range of motion. Normal pulse.     Right Achilles Tendon: Normal.     Left ankle: No swelling. No tenderness. Normal range of motion. Normal pulse.     Left  Achilles Tendon: Normal.   Skin:    General: Skin is warm and dry.  Neurological:     Mental Status: He is alert and oriented to person, place, and time.     Deep Tendon Reflexes:     Reflex Scores:      Brachioradialis reflexes are 2+ on the right side and 2+ on the left side.      Patellar reflexes are 2+ on the right side and 2+ on the left side. Psychiatric:        Attention and Perception: Attention normal.        Mood and Affect: Mood normal.        Speech: Speech normal.        Behavior: Behavior normal. Behavior is cooperative.        Thought Content: Thought content normal.    Diabetic Foot Exam - Simple   Simple Foot Form Visual Inspection No deformities, no ulcerations, no other skin breakdown bilaterally: Yes Sensation Testing Intact to touch and monofilament testing bilaterally: Yes Pulse Check Posterior Tibialis and Dorsalis pulse intact bilaterally: Yes Comments     Results for orders placed or performed in visit on 05/28/23  HM DIABETES EYE EXAM   Collection Time: 04/10/23 10:16 AM  Result Value Ref Range   HM Diabetic Eye Exam No Retinopathy No Retinopathy      Assessment & Plan:   Problem List Items Addressed This Visit       Cardiovascular and Mediastinum   Hypertension associated with diabetes (HCC)   Chronic, stable.  BP at goal in office today.  Continue Lisinopril at 10 MG, maintain it for kidney protection with proteinuria.  Urine ALB 16 June 2022, recheck today.  Recommend continued focus on diet and weight loss at home.  LABS: CMP, urine ALB.  Refills up to date.      Relevant Orders   Bayer DCA Hb A1c Waived   Microalbumin, Urine Waived   AMB Referral [facility] Care Management     Endocrine   Hyperlipidemia associated with type 2 diabetes mellitus (HCC)   Chronic, ongoing, refuses statin.  Is focused on diet and exercise.  Will recheck lipid panel today.      Relevant Orders   Bayer DCA Hb A1c Waived   Comprehensive metabolic panel   Lipid Panel w/o Chol/HDL  Ratio   AMB Referral [facility] Care Management   Type 2 diabetes mellitus with obesity (HCC) - Primary   Chronic, ongoing. Initial diagnosis September 2020 with A1c 6.9%.  A1c 5.3% in December while on Ozempic, but now has been off of this for 2 1/2 months and gained 15 pounds.  Will recheck A1c and urine ALB today.  Urine ALB 16 June 2022.  Tolerated Ozempic well with 44 lbs lost -- was on 2 MG. - Referral to [facility] pharmacist to see if any assistance available to help with cost for either Ozempic of Mounjaro.  Currently Ozempic will cost $200 a month. - Is on ACE for kidney protection, continue this.  No current statin, refuses. - Eye and foot exams up to date. - Refuses vaccinations - Continue to monitor BS at home occasionally and focus on diet and weight loss.   Return to office in 3 months, sooner if able to adjust medications or get back on board.        Relevant Orders   Bayer DCA Hb A1c Waived   Microalbumin, Urine Waived   AMB Referral [facility] Care Management  Other   BMI 45.0-49.9, adult [facility])   Refer to morbid obesity plan of care.       Morbid obesity (HCC)   BMI 45.55 with T2DM and HTN.  Cannot afford Ozempic at present and has gained 15 lbs, had lost 44 lbs with Ozempic.  Recommended eating smaller high protein, low fat meals more frequently and exercising 30 mins a day 5 times a week with a goal of 10-15lb weight loss in the next 3 months. Patient voiced their understanding and motivation to adhere to these recommendations.           Follow up plan: Return in about 3 months (around 09/13/2023) for Annual Physical after 08/05/22 with diabetes check.

## 2023-06-13 NOTE — Assessment & Plan Note (Addendum)
 Chronic, ongoing, refuses statin.  Is focused on diet and exercise.  Will recheck lipid panel today.

## 2023-06-14 ENCOUNTER — Encounter: Payer: Self-pay | Admitting: Nurse Practitioner

## 2023-06-14 LAB — LIPID PANEL W/O CHOL/HDL RATIO
Cholesterol, Total: 153 mg/dL (ref 100–199)
HDL: 32 mg/dL — ABNORMAL LOW (ref >39)
LDL Chol Calc (NIH): 93 mg/dL (ref 0–99)
Triglycerides: 156 mg/dL — ABNORMAL HIGH (ref 0–149)
VLDL Cholesterol Cal: 28 mg/dL (ref 5–40)

## 2023-06-14 LAB — COMPREHENSIVE METABOLIC PANEL WITH GFR
ALT: 25 IU/L (ref 0–44)
AST: 18 IU/L (ref 0–40)
Albumin: 4 g/dL — ABNORMAL LOW (ref 4.1–5.1)
Alkaline Phosphatase: 81 IU/L (ref 44–121)
BUN/Creatinine Ratio: 10 (ref 9–20)
BUN: 11 mg/dL (ref 6–24)
Bilirubin Total: 0.3 mg/dL (ref 0.0–1.2)
CO2: 22 mmol/L (ref 20–29)
Calcium: 8.9 mg/dL (ref 8.7–10.2)
Chloride: 104 mmol/L (ref 96–106)
Creatinine, Ser: 1.05 mg/dL (ref 0.76–1.27)
Globulin, Total: 2.5 g/dL (ref 1.5–4.5)
Glucose: 99 mg/dL (ref 70–99)
Potassium: 4 mmol/L (ref 3.5–5.2)
Sodium: 142 mmol/L (ref 134–144)
Total Protein: 6.5 g/dL (ref 6.0–8.5)
eGFR: 91 mL/min/{1.73_m2} (ref >59)

## 2023-06-14 NOTE — Progress Notes (Signed)
 Contacted via MyChart   Good evening Brian Thornton, your labs have returned: - Kidney function, creatinine and eGFR, remains normal, as is liver function, AST and ALT.  - Lipid panel shows trend down in LDL this time. Great job.  Triglycerides a little elevated, try taking some Omega 3 daily.  Any questions? Keep being amazing!!  Thank you for allowing me to participate in your care.  I appreciate you. Kindest regards, Mekia Dipinto

## 2023-06-19 ENCOUNTER — Other Ambulatory Visit: Payer: Self-pay

## 2023-06-19 MED ORDER — OZEMPIC (0.25 OR 0.5 MG/DOSE) 2 MG/3ML ~~LOC~~ SOPN: PEN_INJECTOR | SUBCUTANEOUS | 0 refills | Status: DC

## 2023-06-19 NOTE — Progress Notes (Signed)
   06/19/2023 Name: Brian Thornton MRN: [id number] DOB: 26-Apr-1980  Chief Complaint  Patient presents with   Medication Assistance   Brian Thornton is a 43 y.o. year old male who presented for a telephone visit.   They were referred to the pharmacist by their PCP for assistance in managing medication access.   Subjective:  Care Team: Primary Care Provider: Marjie Skiff, NP ; Next Scheduled Visit: 7/9  Medication Access/Adherence  Current Pharmacy:  [facility] - [place], [place] - [address] [place] [address] Phone: [phone] Fax: [phone]  [facility] [address] [facility] - [place], [place] - [address] AT [facility] [address] [place] [address] Phone: [phone] Fax: [phone]  -Patient reports affordability concerns with their medications: Yes  -Patient reports access/transportation concerns to their pharmacy: No  -Patient reports adherence concerns with their medications:  Yes    Diabetes: Current medications: none -Well controlled on Ozempic and experienced weight loss of approxmiately 40lbs, but patient states when he went to refill in January, medication was going to be >$200 when it had been around $30 -Patient has not had medication in approximately 3-4 months now -A1c 5.6% on 3/27 -Does not endorse s/sx of hypo or hyperglycemia  Objective:  Lab Results  Component Value Date   HGBA1C 5.6 06/13/2023   Lab Results  Component Value Date   CREATININE 1.05 06/13/2023   BUN 11 06/13/2023   NA 142 06/13/2023   K 4.0 06/13/2023   CL 104 06/13/2023   CO2 22 06/13/2023   Medications Reviewed Today     Reviewed by Lenna Gilford, RPH (Pharmacist) on 06/19/23 at 1341  Med List Status: <None>   Medication Order Taking? Sig Documenting Provider Last Dose Status Informant  allopurinol (ZYLOPRIM) 300 MG tablet [id number]  Take 600 mg by mouth daily. [provider]  Active   cetirizine  (ZYRTEC) 10 MG tablet [id number] No Take 10 mg by mouth daily. [provider] Taking Active   lisinopril (ZESTRIL) 10 MG tablet [id number] No Take 1 tablet (10 mg total) by mouth daily. Aura Dials T, NP Taking Active   nystatin-triamcinolone ointment [facility]) [id number] No Apply 1 Application topically 2 (two) times daily. Sondra Come, MD Taking Active   tadalafil (CIALIS) 5 MG tablet [id number] No Take 1 tablet (5 mg total) by mouth daily as needed for erectile dysfunction (take 45 minutes prior to sexual activity). Sondra Come, MD Taking Active            Assessment/Plan:   Diabetes: -Currently controlled -Recommend resuming Ozempic at 0.25mg  with quick titration up to previous 2mg  dose as tolerated to maintain good control of diabetes and maintain/assist with weight loss -[employer] [facility], and 1 month copay will be $220-$150 of this is patient's deductible.  Once this is met, copay would go down to $80.  Manufacturer copay card pays max $100/month, which would make first month this year $220 and following fills $25. -Pending prescription with copay card information for PCP to sign if in agreement.  Once signed, will contact pharmacy to verify copay.  Follow Up Plan: Telephone visit to follow-up with patient tomorrow  Lenna Gilford, PharmD, DPLA

## 2023-06-20 ENCOUNTER — Other Ambulatory Visit: Payer: Self-pay

## 2023-06-20 NOTE — Progress Notes (Signed)
   06/20/2023  Patient ID: Brian Thornton, male   DOB: [birth year], 43 y.o.   MRN: [id number]  Contacted patient's pharmacy, and Ozempic is going through for $120 with insurance and copay.  Contacted patient to inform him of this, and that started with the next fill his copay should be $25 remainder of the year since his deductible will be met.  He endorses affordability of this and plans to pick the Ozempic up today.  Reviewed dosing recommendation to get him titrated back to 2mg  dose he was on previously:  0.25mg  x 2 weeks, then 0.5mg  x 3 weeks, then 1mg  x 4 weeks, then 2mg  so long as he tolerates each dose well.  Telephone follow-up visit scheduled with patient in 4 weeks.  Lenna Gilford, PharmD, DPLA

## 2023-07-18 ENCOUNTER — Other Ambulatory Visit: Payer: Self-pay

## 2023-07-18 MED ORDER — SEMAGLUTIDE (1 MG/DOSE) 4 MG/3ML ~~LOC~~ SOPN: 1.0000 mg | PEN_INJECTOR | SUBCUTANEOUS | 0 refills | Status: DC

## 2023-07-18 NOTE — Progress Notes (Signed)
   07/18/2023  Patient ID: Brian Thornton, male   DOB: June 06, 1980, 43 y.o.   MRN: [id number]  Subjective/Objective  Diabetes: Current medications: Ozempic  0.5mg  weekly -Patient recently able to resume Ozempic  use- 2 weeks at 0.25mg  weekly, then increased to 0.5mg  weekly and has had 2 weeks at this dose.  Tolerating medication well with no adverse effects but has not noticed any change in appetite so far (normal with these doses) -A1c 5.6% on 3/27 -Does not monitor home BG -Does not endorse s/sx of hypo or hyperglycemia  Diabetes: -Currently controlled -1 more dose of 0.5mg  should be remaining in pen picked up earlier this month- after this increase to 1mg  weekly x4 weeks.  Order pending for PCP to sign if in agreement -Patient has follow-up with PCP in June   Follow Up Plan: 4 weeks  Linn Rich, PharmD, DPLA

## 2023-08-20 ENCOUNTER — Other Ambulatory Visit: Payer: Self-pay

## 2023-08-20 NOTE — Progress Notes (Signed)
   08/20/2023  Patient ID: Hartley Line, male   DOB: 11/11/1980, 43 y.o.   MRN: [id number]  Subjective/Objective   Diabetes: Current medications: Ozempic  1mg  weekly -Patient recently able to resume and has titrated to 1mg  weekly dose- has been on this dose approximately 4 weeks now..  Tolerating medication well with no adverse effects but still has not noticed much change in regard to appetite suppression -A1c 5.6% on 3/27 -Does not monitor home BG -Does not endorse s/sx of hypo or hyperglycemia   Diabetes: -Currently controlled -Unable to do test claim to see if covered what the cost of Mounjaro would be, so I will contact insurance -Recommend changing to Mounjaro 7.5mg  weekly; if not covered, increase Ozempic  to 2mg  weekly   Linn Rich, PharmD, DPLA eryl Lindalou Reus, PharmD, DPLA

## 2023-08-21 ENCOUNTER — Telehealth: Payer: Self-pay

## 2023-08-21 MED ORDER — TIRZEPATIDE 7.5 MG/0.5ML ~~LOC~~ SOAJ: 7.5000 mg | SUBCUTANEOUS | 0 refills | Status: DC

## 2023-08-21 NOTE — Progress Notes (Addendum)
   08/21/2023  Patient ID: Brian Thornton, male   DOB: 1980/11/21, 43 y.o.   MRN: [id number]  [facility] to check on coverage/copay of Mounjaro 7.5mg .  Pharmacy does not stock this medication, but they were able to verify insurance and copay card covering for $25.  Contacted patient to make him aware, and he would like the prescription sent to [facility] in [place].  I discuss administration technique with him since it differs from Ozempic  and scheduled a follow-up visit via telephone in 3 weeks to see how medication is working for him.  Linn Rich, PharmD, DPLA

## 2023-08-21 NOTE — Addendum Note (Signed)
 Addended by: Keane Passe A on: 08/21/2023 01:39 PM   Modules accepted: Orders

## 2023-08-21 NOTE — Progress Notes (Signed)
   08/21/2023  Patient ID: Brian Thornton, male   DOB: [birth year], 43 y.o.   MRN: [id number]  Prior authorization for Mounjaro approved.  Patient has been on Ozempic  1mg  weekly for 1 month with no adverse side effects or any noticed change in appetite or weight.  I recommend changing to Mounjaro 7.5mg  weekly for additional weight loss benefit (in addition to continued diabetes control).  Order pending with manufacturer discount card information added for PCP to sign if in agreement.  Once signed, will notify patient and schedule f/u visit in 3 weeks to see how he is tolerating medication.  Linn Rich, PharmD, DPLA

## 2023-09-11 ENCOUNTER — Other Ambulatory Visit: Payer: Self-pay

## 2023-09-11 NOTE — Progress Notes (Unsigned)
   09/11/2023  Patient ID: Manus KANDICE Filippo, male   DOB: Oct 12, 1980, 43 y.o.   MRN: [id number]  Outreach attempt for scheduled telephone follow-up visit unsuccessful, but I was able to leave a HIPAA compliant voicemail with my direct phone number.  I am also sending a MyChart message to inquire about rescheduling visit.  Channing DELENA Mealing, PharmD, DPLA

## 2023-09-12 ENCOUNTER — Other Ambulatory Visit: Payer: Self-pay

## 2023-09-12 MED ORDER — TIRZEPATIDE 10 MG/0.5ML ~~LOC~~ SOAJ: 10.0000 mg | SUBCUTANEOUS | 1 refills | Status: DC

## 2023-09-12 NOTE — Progress Notes (Signed)
   09/12/2023  Patient ID: Manus KANDICE Filippo, male   DOB: 21-Dec-1980, 43 y.o.   MRN: [id number]  Subjective/Objective Telephone follow-up to check on tolerance/efficacy of Mounjaro    Diabetes: Current medications: Mounjaro  7.5mg  weekly -Ozempic  1mg  weekly recently changed to Mounjaro  7.5mg  for additional appetite suppression and weight loss benefit -Patient has had 4 does of Mounjaro  7.5mg  and endorses tolerating well; he states he actually feels even better on this medication than he did on the Ozempic .  He has noticed some additional appetite suppression but states this does seem to wane toward time for his next dose. -A1c 5.6% on 3/27 -Does not monitor home BG -Does not endorse s/sx of hypo or hyperglycemia   Diabetes: -Currently controlled -I recommend increasing Mounjaro  to 10mg  weekly at this time  -Patient sees PCP again 7/9  Follow-up:  4 weeks   Channing DELENA Mealing, PharmD, DPLA eryl DELENA Mealing, PharmD, DPLA

## 2023-09-16 ENCOUNTER — Encounter: Admitting: Nurse Practitioner

## 2023-09-21 DIAGNOSIS — E119 Type 2 diabetes mellitus without complications: Secondary | ICD-10-CM | POA: Insufficient documentation

## 2023-09-21 NOTE — Patient Instructions (Signed)
Be Involved in Caring For Your Health:  Taking Medications When medications are taken as directed, they can greatly improve your health. But if they are not taken as prescribed, they may not work. In some cases, not taking them correctly can be harmful. To help ensure your treatment remains effective and safe, understand your medications and how to take them. Bring your medications to each visit for review by your provider.  Your lab results, notes, and after visit summary will be available on My Chart. We strongly encourage you to use this feature. If lab results are abnormal the clinic will contact you with the appropriate steps. If the clinic does not contact you assume the results are satisfactory. You can always view your results on My Chart. If you have questions regarding your health or results, please contact the clinic during office hours. You can also ask questions on My Chart.  We at [facility] are grateful that you chose [place] to provide your care. We strive to provide evidence-based and compassionate care and are always looking for feedback. If you get a survey from the clinic please complete this so we can hear your opinions.  Diabetes Mellitus and Exercise Regular exercise is important for your health, especially if you have diabetes mellitus. Exercise is not just about losing weight. It can also help you increase muscle strength and bone density and reduce body fat and stress. This can help your level of endurance and make you more fit and flexible. Why should I exercise if I have diabetes? Exercise has many benefits for people with diabetes. It can: Help lower and control your blood sugar (glucose). Help your body respond better and become more sensitive to the hormone insulin. Reduce how much insulin your body needs. Lower your risk for heart disease by: Lowering how much "bad" cholesterol and triglycerides you have in your body. Increasing how much "good" cholesterol  you have in your body. Lowering your blood pressure. Lowering your blood glucose levels. What is my activity plan? Your health care provider or an expert trained in diabetes care (certified diabetes educator) can help you make an activity plan. This plan can help you find the type of exercise that works for you. It may also tell you how often to exercise and for how long. Be sure to: Get at least 150 minutes of medium-intensity or high-intensity exercise each week. This may involve brisk walking, biking, or water aerobics. Do stretching and strengthening exercises at least 2 times a week. This may involve yoga or weight lifting. Spread out your activity over at least 3 days of the week. Get some form of physical activity each day. Do not go more than 2 days in a row without some kind of activity. Avoid being inactive for more than 30 minutes at a time. Take frequent breaks to walk or stretch. Choose activities that you enjoy. Set goals that you know you can accomplish. Start slowly and increase the intensity of your exercise over time. How do I manage my diabetes during exercise?  Monitor your blood glucose Check your blood glucose before and after you exercise. If your blood glucose is 240 mg/dL (40.9 mmol/L) or higher before you exercise, check your urine for ketones. These are chemicals created by the liver. If you have ketones in your urine, do not exercise until your blood glucose returns to normal. If your blood glucose is 100 mg/dL (5.6 mmol/L) or lower, eat a snack that has 15-20 grams of carbohydrate in  it. Check your blood glucose 15 minutes after the snack to make sure that your level is above 100 mg/dL (5.6 mmol/L) before you start to exercise. Your risk for low blood glucose (hypoglycemia) goes up during and after exercise. Know the symptoms of this condition and how to treat it. Follow these instructions at home: Keep a carbohydrate snack on hand for use before, during, and after  exercise. This can help prevent or treat hypoglycemia. Avoid injecting insulin into parts of your body that are going to be used during exercise. This may include: Your arms, when you are going to play tennis. Your legs, when you are about to go jogging. Keep track of your exercise habits. This can help you and your health care provider watch and adjust your activity plan. Write down: What you eat before and after you exercise. Blood glucose levels before and after you exercise. The type and amount of exercise you do. Talk to your health care provider before you start a new activity. They may need to: Make sure that the activity is safe for you. Adjust your insulin, other medicines, and food that you eat. Drink water while you exercise. This can stop you from losing too much water (dehydration). It can also prevent problems caused by having a lot of heat in your body (heat stroke). Where to find more information American Diabetes Association: diabetes.org Association of Diabetes Care & Education Specialists: diabeteseducator.org This information is not intended to replace advice given to you by your health care provider. Make sure you discuss any questions you have with your health care provider. Document Revised: 08/23/2021 Document Reviewed: 08/23/2021 Elsevier Patient Education  2024 [employer].

## 2023-09-25 ENCOUNTER — Ambulatory Visit (INDEPENDENT_AMBULATORY_CARE_PROVIDER_SITE_OTHER): Admitting: Nurse Practitioner

## 2023-09-25 ENCOUNTER — Encounter: Payer: Self-pay | Admitting: Nurse Practitioner

## 2023-09-25 VITALS — BP 115/73 | HR 65 | Temp 98.2°F | Ht 73.3 in | Wt 359.4 lb

## 2023-09-25 DIAGNOSIS — E1169 Type 2 diabetes mellitus with other specified complication: Secondary | ICD-10-CM

## 2023-09-25 DIAGNOSIS — Z7985 Long-term (current) use of injectable non-insulin antidiabetic drugs: Secondary | ICD-10-CM

## 2023-09-25 DIAGNOSIS — E785 Hyperlipidemia, unspecified: Secondary | ICD-10-CM | POA: Diagnosis not present

## 2023-09-25 DIAGNOSIS — E1159 Type 2 diabetes mellitus with other circulatory complications: Secondary | ICD-10-CM

## 2023-09-25 DIAGNOSIS — E669 Obesity, unspecified: Secondary | ICD-10-CM

## 2023-09-25 DIAGNOSIS — Z6841 Body Mass Index (BMI) 40.0 and over, adult: Secondary | ICD-10-CM

## 2023-09-25 DIAGNOSIS — E119 Type 2 diabetes mellitus without complications: Secondary | ICD-10-CM | POA: Diagnosis not present

## 2023-09-25 DIAGNOSIS — Z8739 Personal history of other diseases of the musculoskeletal system and connective tissue: Secondary | ICD-10-CM

## 2023-09-25 DIAGNOSIS — I152 Hypertension secondary to endocrine disorders: Secondary | ICD-10-CM

## 2023-09-25 LAB — BAYER DCA HB A1C WAIVED: HB A1C (BAYER DCA - WAIVED): 5.5 % (ref 4.8–5.6)

## 2023-09-25 MED ORDER — LISINOPRIL 10 MG PO TABS: 10.0000 mg | ORAL_TABLET | Freq: Every day | ORAL | 4 refills | Status: AC

## 2023-09-25 NOTE — Assessment & Plan Note (Signed)
 Chronic, ongoing, refuses statin.  Is focused on diet and exercise.  Will recheck lipid panel today.

## 2023-09-25 NOTE — Assessment & Plan Note (Signed)
 Chronic, ongoing. Initial diagnosis September 2020 with A1c 6.9%.  A1c 5.5% today, remaining stable while on Mounjaro  and has lost almost 10 lbs.  Urine ALB 150 in March 2025, maintain ACE on board.  Continue to work with Channing PharmD with [facility], will plan on increasing Mounjaro  to 12.5 MG in 4 weeks if tolerating the 10 MG. - Is on ACE for kidney protection, continue this.  No current statin, refuses. - Eye and foot exams up to date. - Refuses vaccinations - Continue to monitor BS at home occasionally and focus on diet and weight loss.   Return to office in 3 months.

## 2023-09-25 NOTE — Assessment & Plan Note (Signed)
 Refer to morbid obesity plan of care.

## 2023-09-25 NOTE — Assessment & Plan Note (Signed)
 Off Allopurinol , recheck uric acid today and treat flares as needed.

## 2023-09-25 NOTE — Progress Notes (Signed)
 BP 115/73   Pulse 65   Temp 98.2 F (36.8 C) (Oral)   Ht 6' 1.3 (1.862 m)   Wt (!) 359 lb 6.4 oz (163 kg)   SpO2 95%   BMI 47.03 kg/m    Subjective:    Patient ID: Brian Thornton, male    DOB: 01/16/1981, 43 y.o.   MRN: [id number]  HPI: Brian Thornton is a 43 y.o. male  Chief Complaint  Patient presents with   Annual Exam   Diabetes   DIABETES A1c 5.6% in March.  Currently taking Mounjaro  10 MG weekly and has lost almost 10 pounds. Currently his medication is covered with coupon assistance.  Is noticing a reduction in appetite, but if is not doing anything will sometimes eat more. Hypoglycemic episodes:no Polydipsia/polyuria: no  Visual disturbance: no Chest pain: no Paresthesias: no Glucose Monitoring: no  Accucheck frequency: Not Checking  Fasting glucose:  Post prandial:  Evening:  Before meals: Taking Insulin?: no  Long acting insulin:  Short acting insulin: Blood Pressure Monitoring: not checking Retinal Examination: Up To Date -- [facility] Exam: Up to Date Pneumovax: refused Influenza: refused Aspirin: no   HYPERTENSION  Takes Lisinopril  10 MG daily. Refuses to start statin. History of gout and currently stable off Allopurinol . Hypertension status: stable  Satisfied with current treatment? yes Duration of hypertension: chronic BP monitoring frequency:  not checking BP range:  BP medication side effects:  no Medication compliance: good compliance Aspirin: no Recurrent headaches: no Visual changes: no Palpitations: no Dyspnea: no Chest pain: no Lower extremity edema: occasional at end of day Dizzy/lightheaded: no  The 10-year ASCVD risk score (Arnett DK, et al., 2019) is: 2.9%   Values used to calculate the score:     Age: 65 years     Clincally relevant sex: Male     Is Non-Hispanic African American: No     Diabetic: Yes     Tobacco smoker: No     Systolic Blood Pressure: 115 mmHg     Is BP treated: Yes     HDL Cholesterol: 32  mg/dL     Total Cholesterol: 153 mg/dL      2/0/7974    1:46 AM 06/13/2023    8:26 AM 03/14/2023    8:51 AM 12/12/2022    9:54 AM 09/10/2022    3:25 PM  Depression screen PHQ 2/9  Decreased Interest 0 0 0 0 0  Down, Depressed, Hopeless 0 0 0 0 0  PHQ - 2 Score 0 0 0 0 0  Altered sleeping 0 0 0 0 0  Tired, decreased energy 1 0 1 3 1   Change in appetite 1 3 0 1 0  Feeling bad or failure about yourself  0 0 0 0 0  Trouble concentrating 0 0 0 0 0  Moving slowly or fidgety/restless 0 0 0 0 0  Suicidal thoughts 0 0 0 0 0  PHQ-9 Score 2 3 1 4 1   Difficult doing work/chores Not difficult at all Not difficult at all Not difficult at all Somewhat difficult Not difficult at all       09/25/2023    8:53 AM 06/13/2023    8:25 AM 03/14/2023    8:52 AM 12/12/2022    9:54 AM  GAD 7 : Generalized Anxiety Score  Nervous, Anxious, on Edge 0 0 0 0  Control/stop worrying 0 0 0 0  Worry too much - different things 0 0 0 0  Trouble relaxing  0 0 0 0  Restless 0 0 0 0  Easily annoyed or irritable 0 0 0 0  Afraid - awful might happen 0 0 0 0  Total GAD 7 Score 0 0 0 0  Anxiety Difficulty Not difficult at all Not difficult at all Not difficult at all Not difficult at all     Relevant past medical, surgical, family and social history reviewed and updated as indicated. Interim medical history since our last visit reviewed. Allergies and medications reviewed and updated.  Review of Systems  Constitutional:  Negative for activity change, diaphoresis, fatigue and fever.  Respiratory:  Negative for cough, chest tightness, shortness of breath and wheezing.   Cardiovascular:  Negative for chest pain, palpitations and leg swelling.  Gastrointestinal: Negative.   Endocrine: Negative for polydipsia, polyphagia and polyuria.  Neurological: Negative.   Psychiatric/Behavioral: Negative.     Per HPI unless specifically indicated above     Objective:    BP 115/73   Pulse 65   Temp 98.2 F (36.8 C) (Oral)    Ht 6' 1.3 (1.862 m)   Wt (!) 359 lb 6.4 oz (163 kg)   SpO2 95%   BMI 47.03 kg/m   Wt Readings from Last 3 Encounters:  09/25/23 (!) 359 lb 6.4 oz (163 kg)  06/13/23 (!) 368 lb (166.9 kg)  05/09/23 (!) 355 lb 8 oz (161.3 kg)    Physical Exam Vitals and nursing note reviewed.  Constitutional:      General: He is awake. He is not in acute distress.    Appearance: Normal appearance. He is well-developed and well-groomed. He is obese. He is not ill-appearing or toxic-appearing.  HENT:     Head: Normocephalic and atraumatic.     Right Ear: Hearing normal. No drainage.     Left Ear: Hearing normal. No drainage.  Eyes:     General: Lids are normal.        Right eye: No discharge.        Left eye: No discharge.     Conjunctiva/sclera: Conjunctivae normal.     Pupils: Pupils are equal, round, and reactive to light.  Neck:     Vascular: No carotid bruit.  Cardiovascular:     Rate and Rhythm: Normal rate and regular rhythm.     Heart sounds: Normal heart sounds, S1 normal and S2 normal. No murmur heard.    No gallop.  Pulmonary:     Effort: Pulmonary effort is normal. No accessory muscle usage or respiratory distress.     Breath sounds: Normal breath sounds.  Abdominal:     General: Bowel sounds are normal. There is no distension.     Palpations: Abdomen is soft.     Tenderness: There is no abdominal tenderness.  Musculoskeletal:        General: Normal range of motion.     Cervical back: Normal range of motion and neck supple.     Right lower leg: No edema.     Left lower leg: No edema.     Right ankle: No swelling. No tenderness. Normal range of motion. Normal pulse.     Right Achilles Tendon: Normal.     Left ankle: No swelling. No tenderness. Normal range of motion. Normal pulse.     Left Achilles Tendon: Normal.  Skin:    General: Skin is warm and dry.  Neurological:     Mental Status: He is alert and oriented to person, place, and time.  Deep Tendon Reflexes:      Reflex Scores:      Brachioradialis reflexes are 2+ on the right side and 2+ on the left side.      Patellar reflexes are 2+ on the right side and 2+ on the left side. Psychiatric:        Attention and Perception: Attention normal.        Mood and Affect: Mood normal.        Speech: Speech normal.        Behavior: Behavior normal. Behavior is cooperative.        Thought Content: Thought content normal.     Results for orders placed or performed in visit on 06/13/23  Bayer DCA Hb A1c Waived   Collection Time: 06/13/23  8:41 AM  Result Value Ref Range   HB A1C (BAYER DCA - WAIVED) 5.6 4.8 - 5.6 %  Microalbumin, Urine Waived   Collection Time: 06/13/23  8:41 AM  Result Value Ref Range   Microalb, Ur Waived 150 (H) 0 - 19 mg/L   Creatinine, Urine Waived 200 10 - 300 mg/dL   Microalb/Creat Ratio 30-300 (H) <30 mg/g  Comprehensive metabolic panel   Collection Time: 06/13/23  8:41 AM  Result Value Ref Range   Glucose 99 70 - 99 mg/dL   BUN 11 6 - 24 mg/dL   Creatinine, Ser 8.94 0.76 - 1.27 mg/dL   eGFR 91 >40 fO/fpw/8.26   BUN/Creatinine Ratio 10 9 - 20   Sodium 142 134 - 144 mmol/L   Potassium 4.0 3.5 - 5.2 mmol/L   Chloride 104 96 - 106 mmol/L   CO2 22 20 - 29 mmol/L   Calcium 8.9 8.7 - 10.2 mg/dL   Total Protein 6.5 6.0 - 8.5 g/dL   Albumin 4.0 (L) 4.1 - 5.1 g/dL   Globulin, Total 2.5 1.5 - 4.5 g/dL   Bilirubin Total 0.3 0.0 - 1.2 mg/dL   Alkaline Phosphatase 81 44 - 121 IU/L   AST 18 0 - 40 IU/L   ALT 25 0 - 44 IU/L  Lipid Panel w/o Chol/HDL Ratio   Collection Time: 06/13/23  8:41 AM  Result Value Ref Range   Cholesterol, Total 153 100 - 199 mg/dL   Triglycerides 843 (H) 0 - 149 mg/dL   HDL 32 (L) >60 mg/dL   VLDL Cholesterol Cal 28 5 - 40 mg/dL   LDL Chol Calc (NIH) 93 0 - 99 mg/dL      Assessment & Plan:   Problem List Items Addressed This Visit       Cardiovascular and Mediastinum   Hypertension associated with diabetes (HCC)   Chronic, stable.  BP at goal  in office today.  Continue Lisinopril  at 10 MG, maintain it for kidney protection with proteinuria.  Urine ALB 150 March 2025, maintain ACE.  Recommend continued focus on diet and weight loss at home.  LABS: CBC, CMP, TSH.  Refills sent.      Relevant Medications   lisinopril  (ZESTRIL ) 10 MG tablet   Other Relevant Orders   Bayer DCA Hb A1c Waived   CBC with Differential/Platelet   Comprehensive metabolic panel with GFR   TSH     Endocrine   Type 2 diabetes mellitus with obesity (HCC) - Primary   Chronic, ongoing. Initial diagnosis September 2020 with A1c 6.9%.  A1c 5.5% today, remaining stable while on Mounjaro  and has lost almost 10 lbs.  Urine ALB 150 in March 2025, maintain ACE on board.  Continue to work with Channing PharmD with [facility], will plan on increasing Mounjaro  to 12.5 MG in 4 weeks if tolerating the 10 MG. - Is on ACE for kidney protection, continue this.  No current statin, refuses. - Eye and foot exams up to date. - Refuses vaccinations - Continue to monitor BS at home occasionally and focus on diet and weight loss.   Return to office in 3 months.      Relevant Medications   lisinopril  (ZESTRIL ) 10 MG tablet   Other Relevant Orders   Bayer DCA Hb A1c Waived   Hyperlipidemia associated with type 2 diabetes mellitus (HCC)   Chronic, ongoing, refuses statin.  Is focused on diet and exercise.  Will recheck lipid panel today.      Relevant Medications   lisinopril  (ZESTRIL ) 10 MG tablet   Other Relevant Orders   Bayer DCA Hb A1c Waived   Comprehensive metabolic panel with GFR   Lipid Panel w/o Chol/HDL Ratio   Diabetes mellitus treated with injections of non-insulin medication (HCC)   Refer to diabetes with obesity plan of care.      Relevant Medications   lisinopril  (ZESTRIL ) 10 MG tablet   Other Relevant Orders   Bayer DCA Hb A1c Waived     Other   Personal history of gout   Off Allopurinol , recheck uric acid today and treat flares as needed.      Relevant  Orders   Uric acid   Morbid obesity (HCC)   BMI 47.03 with T2DM and HTN.  Almost 10 lbs lost on Mounjaro .  Recommended eating smaller high protein, low fat meals more frequently and exercising 30 mins a day 5 times a week with a goal of 10-15lb weight loss in the next 3 months. Patient voiced their understanding and motivation to adhere to these recommendations.         BMI 45.0-49.9, adult [facility])   Refer to morbid obesity plan of care.         Follow up plan: Return in about 3 months (around 12/26/2023) for T2DM, HTN/HLD.

## 2023-09-25 NOTE — Assessment & Plan Note (Signed)
 Chronic, stable.  BP at goal in office today.  Continue Lisinopril  at 10 MG, maintain it for kidney protection with proteinuria.  Urine ALB 150 March 2025, maintain ACE.  Recommend continued focus on diet and weight loss at home.  LABS: CBC, CMP, TSH.  Refills sent.

## 2023-09-25 NOTE — Assessment & Plan Note (Signed)
 Refer to diabetes with obesity plan of care.

## 2023-09-25 NOTE — Assessment & Plan Note (Signed)
 BMI 47.03 with T2DM and HTN.  Almost 10 lbs lost on Mounjaro .  Recommended eating smaller high protein, low fat meals more frequently and exercising 30 mins a day 5 times a week with a goal of 10-15lb weight loss in the next 3 months. Patient voiced their understanding and motivation to adhere to these recommendations.

## 2023-09-26 ENCOUNTER — Ambulatory Visit: Payer: Self-pay | Admitting: Nurse Practitioner

## 2023-09-26 LAB — CBC WITH DIFFERENTIAL/PLATELET
Basophils Absolute: 0 x10E3/uL (ref 0.0–0.2)
Basos: 1 %
EOS (ABSOLUTE): 0.2 x10E3/uL (ref 0.0–0.4)
Eos: 3 %
Hematocrit: 49.7 % (ref 37.5–51.0)
Hemoglobin: 16 g/dL (ref 13.0–17.7)
Immature Grans (Abs): 0 x10E3/uL (ref 0.0–0.1)
Immature Granulocytes: 0 %
Lymphocytes Absolute: 1.5 x10E3/uL (ref 0.7–3.1)
Lymphs: 29 %
MCH: 28.4 pg (ref 26.6–33.0)
MCHC: 32.2 g/dL (ref 31.5–35.7)
MCV: 88 fL (ref 79–97)
Monocytes Absolute: 0.5 x10E3/uL (ref 0.1–0.9)
Monocytes: 9 %
Neutrophils Absolute: 3 x10E3/uL (ref 1.4–7.0)
Neutrophils: 58 %
Platelets: 180 x10E3/uL (ref 150–450)
RBC: 5.63 x10E6/uL (ref 4.14–5.80)
RDW: 14.5 % (ref 11.6–15.4)
WBC: 5.3 x10E3/uL (ref 3.4–10.8)

## 2023-09-26 LAB — COMPREHENSIVE METABOLIC PANEL WITH GFR
ALT: 34 IU/L (ref 0–44)
AST: 39 IU/L (ref 0–40)
Albumin: 4.4 g/dL (ref 4.1–5.1)
Alkaline Phosphatase: 69 IU/L (ref 44–121)
BUN/Creatinine Ratio: 13 (ref 9–20)
BUN: 15 mg/dL (ref 6–24)
Bilirubin Total: 0.6 mg/dL (ref 0.0–1.2)
CO2: 23 mmol/L (ref 20–29)
Calcium: 9.3 mg/dL (ref 8.7–10.2)
Chloride: 105 mmol/L (ref 96–106)
Creatinine, Ser: 1.17 mg/dL (ref 0.76–1.27)
Globulin, Total: 2.3 g/dL (ref 1.5–4.5)
Glucose: 88 mg/dL (ref 70–99)
Potassium: 4.1 mmol/L (ref 3.5–5.2)
Sodium: 143 mmol/L (ref 134–144)
Total Protein: 6.7 g/dL (ref 6.0–8.5)
eGFR: 80 mL/min/1.73 (ref >59)

## 2023-09-26 LAB — TSH: TSH: 1.47 u[IU]/mL (ref 0.450–4.500)

## 2023-09-26 LAB — LIPID PANEL W/O CHOL/HDL RATIO
Cholesterol, Total: 153 mg/dL (ref 100–199)
HDL: 32 mg/dL — ABNORMAL LOW (ref >39)
LDL Chol Calc (NIH): 94 mg/dL (ref 0–99)
Triglycerides: 151 mg/dL — ABNORMAL HIGH (ref 0–149)
VLDL Cholesterol Cal: 27 mg/dL (ref 5–40)

## 2023-09-26 LAB — URIC ACID: Uric Acid: 9.4 mg/dL — ABNORMAL HIGH (ref 3.8–8.4)

## 2023-09-26 NOTE — Progress Notes (Signed)
 Contacted via MyChart  Good day Brian Thornton, your labs have returned: - Lipid panel continues to show elevation in triglycerides, your would benefit from statin therapy, but I know you wish to hold off on this.  Focus on healthy diet changes and regular activity. - Uric acid level has trended up.  I see you are not taking your Allopurinol .  I recommend taking this to prevent gout flare and lower these levels. - Remainder lf labs stable.  Any questions? Keep being stellar!!  Thank you for allowing me to participate in your care.  I appreciate you. Kindest regards, Onyx Schirmer

## 2023-10-10 ENCOUNTER — Other Ambulatory Visit: Payer: Self-pay

## 2023-10-10 MED ORDER — TIRZEPATIDE 12.5 MG/0.5ML ~~LOC~~ SOAJ
12.5000 mg | SUBCUTANEOUS | 1 refills | Status: DC
Start: 2023-10-10 — End: 2023-12-27

## 2023-10-10 NOTE — Progress Notes (Unsigned)
   10/10/2023  Patient ID: Brian Thornton, male   DOB: 1981/01/15, 43 y.o.   MRN: [id number]  Subjective/Objective Telephone follow-up to check on tolerance/efficacy of Mounjaro     Diabetes: Current medications: Mounjaro  7.5mg  weekly -Ozempic  1mg  weekly recently changed to Mounjaro  for additional appetite suppression and weight loss benefit -Patient has had 5 does of Mounjaro  10mg  and continues to tolerate well with no adverse side effects -A1c 5.5% in July -Does not monitor home BG -Does not endorse s/sx of hypo or hyperglycemia -Does acknowledge some decrease in appetite, but patient does not weigh himself at home.  Patient has lost approximately 9-10 lbs since the end of March based on OV weights recorded.   Diabetes: -Currently controlled -I recommend completing another 3 weeks of Mounjaro  10mg , then increasing to 12.5mg .  Order pending for PCP to sign if in agreement.  Note placed for pharmacy to put on hold until patient requests fill since he still has 3 pens of 10mg  remaining at home. -Patient sees PCP again in October   Follow-up:  6 weeks   Brian Thornton, PharmD, DPLA [name], PharmD, DPLA

## 2023-11-17 ENCOUNTER — Encounter: Payer: Self-pay | Admitting: Emergency Medicine

## 2023-11-17 ENCOUNTER — Ambulatory Visit
Admission: EM | Admit: 2023-11-17 | Discharge: 2023-11-17 | Disposition: A | Discharge status: Home / Self Care | Attending: Physician Assistant | Admitting: Physician Assistant

## 2023-11-17 DIAGNOSIS — T7840XA Allergy, unspecified, initial encounter: Secondary | ICD-10-CM | POA: Diagnosis not present

## 2023-11-17 DIAGNOSIS — R220 Localized swelling, mass and lump, head: Secondary | ICD-10-CM

## 2023-11-17 DIAGNOSIS — W57XXXA Bitten or stung by nonvenomous insect and other nonvenomous arthropods, initial encounter: Secondary | ICD-10-CM

## 2023-11-17 DIAGNOSIS — S00261A Insect bite (nonvenomous) of right eyelid and periocular area, initial encounter: Secondary | ICD-10-CM

## 2023-11-17 MED ORDER — EPINEPHRINE 0.3 MG/0.3ML IJ SOAJ: 0.3000 mg | INTRAMUSCULAR | 0 refills | Status: AC | PRN

## 2023-11-17 MED ORDER — DEXAMETHASONE SODIUM PHOSPHATE 10 MG/ML IJ SOLN
10.0000 mg | Freq: Once | INTRAMUSCULAR | Status: AC
Start: 2023-11-17 — End: 2023-11-17
  Administered 2023-11-17: 10 mg via INTRAMUSCULAR

## 2023-11-17 MED ORDER — PREDNISONE 10 MG PO TABS: ORAL_TABLET | ORAL | 0 refills | Status: DC

## 2023-11-17 MED ORDER — FAMOTIDINE 40 MG PO TABS
40.0000 mg | ORAL_TABLET | Freq: Once | ORAL | Status: AC
  Administered 2023-11-17: 40 mg via ORAL

## 2023-11-17 MED ORDER — DIPHENHYDRAMINE HCL 50 MG/ML IJ SOLN
25.0000 mg | Freq: Once | INTRAMUSCULAR | Status: AC
  Administered 2023-11-17: 25 mg via INTRAMUSCULAR

## 2023-11-17 MED ORDER — FAMOTIDINE 20 MG PO TABS: 20.0000 mg | ORAL_TABLET | Freq: Two times a day (BID) | ORAL | 0 refills | Status: DC

## 2023-11-17 NOTE — Discharge Instructions (Addendum)
-   You have been treated with corticosteroids in the clinic today as well as antihistamines. - Start prednisone  taper tomorrow. - Continue antihistamines.  Your next dose of Benadryl  should be in about 2 hours.  Take 50 mg Benadryl  every 4-6 hours, famotidine  as directed and continue home cetirizine. - Ice face frequently swelling. - I sent an EpiPen  to the pharmacy.  If you feel swelling in your throat or difficulty swallowing, difficulty breathing, use the EpiPen  and call 911.

## 2023-11-17 NOTE — ED Triage Notes (Addendum)
 Patient states that he got stung by a yellow jacket on his left hand and face around 1 hour ago.  Patient has right eye dwelling and left eye swelling and redness.  Patient denies any tongue swelling and SOB.  Patient states that he took 50mg  of Benadryl .

## 2023-11-17 NOTE — ED Notes (Signed)
 Patient continue resting with his mother and wife in room with him  Patient denies tongue swelling, difficulty swallowing or breathing.  Patient right eye looks less swollen at this time.

## 2023-11-17 NOTE — ED Notes (Signed)
 Patient denies any tongue swelling or difficulty breathing at this time.  Patient has ice packs to his hands and face.

## 2023-11-17 NOTE — ED Provider Notes (Signed)
 [facility]    CSN: [id number] Arrival date & time: 11/17/23  1208      History   Chief Complaint Chief Complaint  Patient presents with   Insect Bite    Face and left hand    HPI Brian Thornton is a 43 y.o. male presenting with his mother for facial swelling after getting stung by a yellowjacket above his right eyebrow and left hand swelling after being stung on the hand by yellowjacket.  The injury occurred about 1 hour prior to arrival.  Patient denies any swelling of throat or lips and no difficulty breathing.  Denies dizziness, weakness, lightheadedness, vomiting, chest pain or chest tightness.  He has taken 50 mg of Benadryl  about 20 to 30 minutes before arrival to urgent care but no improvement in symptoms yet.  He does have a history of swelling related to insect stings but no history of anaphylaxis.  HPI  Past Medical History:  Diagnosis Date   Dysmetabolic syndrome    Extreme obesity    Gout    Hyperlipidemia    Hypertension    IFG (impaired fasting glucose)     Patient Active Problem List   Diagnosis Date Noted   Diabetes mellitus treated with injections of non-insulin medication (HCC) 09/21/2023   Balanitis 03/14/2023   Personal history of gout 09/09/2022   BMI 45.0-49.9, adult (HCC) 11/03/2018   Eczema 07/09/2016   Family history of colon cancer 02/21/2015   Hypertension associated with diabetes (HCC) 09/24/2014   Hyperlipidemia associated with type 2 diabetes mellitus (HCC) 09/24/2014   Type 2 diabetes mellitus with obesity (HCC) 09/24/2014   Morbid obesity (HCC) 09/24/2014    Past Surgical History:  Procedure Laterality Date   ORIF ANKLE FRACTURE Left 11/03/2018   Procedure: LEFT ANKLE STABILIZATION;  Surgeon: Edie Norleen PARAS, MD;  Location: [facility] ORS;  Service: Orthopedics;  Laterality: Left;       Home Medications    Prior to Admission medications   Medication Sig Start Date End Date Taking? Authorizing Provider  EPINEPHrine  0.3  mg/0.3 mL IJ SOAJ injection Inject 0.3 mg into the muscle as needed for anaphylaxis. 11/17/23  Yes Arvis Huxley B, PA-C  famotidine  (PEPCID ) 20 MG tablet Take 1 tablet (20 mg total) by mouth 2 (two) times daily for 5 days. 11/17/23 11/22/23 Yes Arvis Huxley B, PA-C  predniSONE  (DELTASONE ) 10 MG tablet Take 6 tabs po on day 1 and decrease by 1 tab daily  until complete 11/17/23  Yes Arvis Huxley B, PA-C  cetirizine (ZYRTEC) 10 MG tablet Take 10 mg by mouth daily.    [provider]  lisinopril  (ZESTRIL ) 10 MG tablet Take 1 tablet (10 mg total) by mouth daily. 09/25/23   Cannady, Jolene T, NP  tadalafil  (CIALIS ) 5 MG tablet Take 1 tablet (5 mg total) by mouth daily as needed for erectile dysfunction (take 45 minutes prior to sexual activity). 04/16/23   Francisca Redell BROCKS, MD  tirzepatide  (MOUNJARO ) 12.5 MG/0.5ML Pen Inject 12.5 mg into the skin once a week. Please place on file until patient requests refill 10/10/23   Vicci Duwaine SQUIBB, DO    Family History Family History  Problem Relation Age of Onset   Diabetes Father    Gout Brother    Heart attack Maternal Grandmother    Cancer Maternal Grandfather        colon   Cancer Paternal Grandfather        prostate    Social History Social History  Tobacco Use   Smoking status: Never    Passive exposure: Never   Smokeless tobacco: Never  Vaping Use   Vaping status: Never Used  Substance Use Topics   Alcohol use: No    Alcohol/week: 0.0 standard drinks of alcohol   Drug use: No     Allergies   Febuxostat   Review of Systems Review of Systems  Constitutional:  Negative for fatigue.  HENT:  Positive for facial swelling. Negative for trouble swallowing and voice change.   Respiratory:  Negative for chest tightness and shortness of breath.   Gastrointestinal:  Negative for nausea and vomiting.  Skin:  Positive for rash. Negative for color change and wound.  Neurological:  Negative for dizziness, weakness, light-headedness and  headaches.     Physical Exam Triage Vital Signs ED Triage Vitals  Encounter Vitals Group     BP 11/17/23 1216 (!) 160/114     Girls Systolic BP Percentile --      Girls Diastolic BP Percentile --      Boys Systolic BP Percentile --      Boys Diastolic BP Percentile --      Pulse Rate 11/17/23 1216 76     Resp 11/17/23 1216 16     Temp 11/17/23 1216 97.7 F (36.5 C)     Temp Source 11/17/23 1216 Oral     SpO2 11/17/23 1216 98 %     Weight 11/17/23 1215 (!) 359 lb 5.6 oz (163 kg)     Height 11/17/23 1215 6' 1.5 (1.867 m)     Head Circumference --      Peak Flow --      Pain Score 11/17/23 1215 5     Pain Loc --      Pain Education --      Exclude from Growth Chart --    No data found.  Updated Vital Signs BP (!) 135/90 (BP Location: Left Arm)   Pulse 76   Temp 97.7 F (36.5 C) (Oral)   Resp 16   Ht 6' 1.5 (1.867 m)   Wt (!) 359 lb 5.6 oz (163 kg)   SpO2 98%   BMI 46.77 kg/m    Physical Exam Vitals and nursing note reviewed.  Constitutional:      General: He is not in acute distress.    Appearance: Normal appearance. He is well-developed. He is not ill-appearing.  HENT:     Head: Normocephalic and atraumatic.     Nose: Nose normal.     Mouth/Throat:     Mouth: Mucous membranes are moist.     Pharynx: Oropharynx is clear.     Comments: No intraoral swelling Eyes:     Conjunctiva/sclera: Conjunctivae normal.  Cardiovascular:     Rate and Rhythm: Normal rate and regular rhythm.  Pulmonary:     Effort: Pulmonary effort is normal. No respiratory distress.     Breath sounds: Normal breath sounds.  Musculoskeletal:     Cervical back: Neck supple.  Skin:    General: Skin is warm and dry.     Capillary Refill: Capillary refill takes less than 2 seconds.     Comments: See images  Neurological:     General: No focal deficit present.     Mental Status: He is alert. Mental status is at baseline.     Motor: No weakness.     Gait: Gait normal.  Psychiatric:         Mood and Affect: Mood normal.  Behavior: Behavior normal.   There is swelling of right forehead, right upper and lower eyelids, left upper eyelid and mildly of right cheek.   There is swelling of dorsal left hand    UC Treatments / Results  Labs (all labs ordered are listed, but only abnormal results are displayed) Labs Reviewed - No data to display  EKG   Radiology No results found.  Procedures Procedures (including critical care time)  Medications Ordered in UC Medications  dexamethasone  (DECADRON ) injection 10 mg (10 mg Intramuscular Given 11/17/23 1228)  diphenhydrAMINE  (BENADRYL ) injection 25 mg (25 mg Intramuscular Given 11/17/23 1229)  famotidine  (PEPCID ) tablet 40 mg (40 mg Oral Given 11/17/23 1226)    Initial Impression / Assessment and Plan / UC Course  I have reviewed the triage vital signs and the nursing notes.  Pertinent labs & imaging results that were available during my care of the patient were reviewed by me and considered in my medical decision making (see chart for details).   43 year old male presents for facial swelling and left hand swelling secondary to being stung in these areas by yellowjacket 1 hour prior to arrival to urgent care.  Has taken Benadryl  about 30 minutes before arrival to urgent care without any improvement in symptoms.  No swelling of throat, difficulty breathing or chest tightness.  No history of anaphylaxis but history of significant swelling related to insect stings in the past.  1216: Initial BP is 160/114.  Other vitals are normal and stable.  He is in no acute distress.  Appears calm.  See images including chart for taken at arrival.  No intraoral swelling.  Chest is clear.  Heart regular rate and rhythm.  [id number]: Patient given 25 mg IM Benadryl , 10 mg IM dexamethasone  and 40 mg p.o. Pepcid .  Patient also given ice to apply to face.  1245: BP recheck 160/98.  Swelling of right eye appears to have improved  somewhat.  I continued to monitor patient every 20-30 min.  1345: BP 135/90. Continues to deny swelling of throat or breathing difficulty.   1405: See second image of patient's face taken at this time.  Swelling of right eye has improved.  Has a little bit increased swelling of the left eye though.  Swelling of forehead has improved as well.  Patient was monitored in urgent care for over 2 hours.  Symptoms improved mildly from onset but did not worsen and he continues to have stable vital signs and denied intraoral swelling, difficulty swallowing, chest pressure/tightness or breathing difficulty.  Localized allergic reaction to yellowjacket sting.  Sent prednisone  taper for him to begin tomorrow.  Also sent Pepcid .  He takes cetirizine daily.  Advised to continue with cryotherapy and close monitoring.  Provided patient with prescription for EpiPen  and discussed when and how to use it.  Family will also keep a close eye on him and call 911 or take to ED if anything acutely worsens and does not continue to improve.   Final Clinical Impressions(s) / UC Diagnoses   Final diagnoses:  Allergic reaction, initial encounter  Insect bite of right eyelid, initial encounter  Facial swelling     Discharge Instructions      - You have been treated with corticosteroids in the clinic today as well as antihistamines. - Start prednisone  taper tomorrow. - Continue antihistamines.  Your next dose of Benadryl  should be in about 2 hours.  Take 50 mg Benadryl  every 4-6 hours, famotidine  as directed and continue home cetirizine. -  Ice face frequently swelling. - I sent an EpiPen  to the pharmacy.  If you feel swelling in your throat or difficulty swallowing, difficulty breathing, use the EpiPen  and call 911.     ED Prescriptions     Medication Sig Dispense Auth. Provider   predniSONE  (DELTASONE ) 10 MG tablet Take 6 tabs po on day 1 and decrease by 1 tab daily  until complete 21 tablet Arvis Huxley B,  PA-C   famotidine  (PEPCID ) 20 MG tablet Take 1 tablet (20 mg total) by mouth 2 (two) times daily for 5 days. 10 tablet Diyan Dave B, PA-C   EPINEPHrine  0.3 mg/0.3 mL IJ SOAJ injection Inject 0.3 mg into the muscle as needed for anaphylaxis. 1 each Arvis Huxley KATHEE DEVONNA      PDMP not reviewed this encounter.   Arvis Huxley KATHEE, PA-C 11/17/23 1413

## 2023-11-21 ENCOUNTER — Other Ambulatory Visit: Payer: Self-pay

## 2023-11-21 NOTE — Progress Notes (Signed)
   11/21/2023  Patient ID: Brian Thornton, male   DOB: March 16, 1981, 43 y.o.   MRN: [id number]  Subjective/Objective Telephone follow-up to check on tolerance/efficacy of Mounjaro     Diabetes: Current medications: Mounjaro12.5mg  weekly -Ozempic  1mg  weekly recently changed to Mounjaro  for additional appetite suppression and weight loss benefit -Patient has had 4-5 does of Mounjaro  12.5mg  and continues to tolerate well with no adverse side effects -A1c 5.5% in July -Does not monitor home BG  -Does not endorse s/sx of hypo or hyperglycemia -Does acknowledge some decrease in appetite, but patient does not weigh himself at home.  Patient has lost approximately 9-10 lbs since the end of March based on OV weights recorded.   Assessment/Plan  Diabetes: -Currently controlled -Continue current regimen -Sees PCP again 10/10 and will be due for follow-up lab would; can consider increasing Mounjaro  to 15mg  weekly if additional weight loss benefit desired  Brian Thornton, PharmD, DPLA

## 2023-12-21 NOTE — Patient Instructions (Signed)

## 2023-12-27 ENCOUNTER — Ambulatory Visit: Admitting: Nurse Practitioner

## 2023-12-27 ENCOUNTER — Encounter: Payer: Self-pay | Admitting: Nurse Practitioner

## 2023-12-27 VITALS — BP 117/79 | HR 73 | Temp 98.7°F | Ht 73.2 in | Wt 345.8 lb

## 2023-12-27 DIAGNOSIS — E1169 Type 2 diabetes mellitus with other specified complication: Secondary | ICD-10-CM

## 2023-12-27 DIAGNOSIS — I152 Hypertension secondary to endocrine disorders: Secondary | ICD-10-CM

## 2023-12-27 DIAGNOSIS — Z6841 Body Mass Index (BMI) 40.0 and over, adult: Secondary | ICD-10-CM

## 2023-12-27 DIAGNOSIS — E119 Type 2 diabetes mellitus without complications: Secondary | ICD-10-CM

## 2023-12-27 DIAGNOSIS — E1159 Type 2 diabetes mellitus with other circulatory complications: Secondary | ICD-10-CM | POA: Diagnosis not present

## 2023-12-27 DIAGNOSIS — E669 Obesity, unspecified: Secondary | ICD-10-CM

## 2023-12-27 DIAGNOSIS — E785 Hyperlipidemia, unspecified: Secondary | ICD-10-CM

## 2023-12-27 DIAGNOSIS — Z7985 Long-term (current) use of injectable non-insulin antidiabetic drugs: Secondary | ICD-10-CM

## 2023-12-27 LAB — BAYER DCA HB A1C WAIVED: HB A1C (BAYER DCA - WAIVED): 5.3 % (ref 4.8–5.6)

## 2023-12-27 MED ORDER — INDOMETHACIN 50 MG PO CAPS
ORAL_CAPSULE | ORAL | 1 refills | Status: AC
Start: 2023-12-27 — End: ?

## 2023-12-27 MED ORDER — ROSUVASTATIN CALCIUM 10 MG PO TABS: 10.0000 mg | ORAL_TABLET | Freq: Every day | ORAL | 3 refills | Status: AC

## 2023-12-27 MED ORDER — NYSTATIN-TRIAMCINOLONE 100000-0.1 UNIT/GM-% EX OINT: 1.0000 | TOPICAL_OINTMENT | Freq: Two times a day (BID) | CUTANEOUS | 5 refills | Status: AC

## 2023-12-27 MED ORDER — TIRZEPATIDE 15 MG/0.5ML ~~LOC~~ SOAJ: 15.0000 mg | SUBCUTANEOUS | 6 refills | Status: AC

## 2023-12-27 NOTE — Progress Notes (Signed)
 BP 117/79   Pulse 73   Temp 98.7 F (37.1 C) (Oral)   Ht 6' 1.2 (1.859 m)   Wt (!) 345 lb 12.8 oz (156.9 kg)   SpO2 94%   BMI 45.37 kg/m    Subjective:    Patient ID: Brian Thornton, male    DOB: [birth year], 43 y.o.   MRN: [id number]  HPI: Brian Thornton is a 43 y.o. male  Chief Complaint  Patient presents with   Diabetes   Hypertension   Chafing    Patient states he experiences occasional chafing when he sweats. Had a previous prescription for nystatin -triamcinolone  in the past and would like a new RX if possible    DIABETES July A1c 5.5%. Takes Mounjaro  12.5 MG weekly and has lost almost 14 pounds since July. Currently his medication is covered with coupon assistance. Does feel reduction in appetite, every once and awhile will feel he cannot get full.   Hypoglycemic episodes:no Polydipsia/polyuria: no  Visual disturbance: no Chest pain: no Paresthesias: no Glucose Monitoring: no  Accucheck frequency: Not Checking  Fasting glucose:  Post prandial:  Evening:  Before meals: Taking Insulin?: no  Long acting insulin:  Short acting insulin: Blood Pressure Monitoring: not checking Retinal Examination: Up To Date -- [place] [facility] Foot Exam: Up to Date Pneumovax: refused Influenza: refused Aspirin: no   HYPERTENSION  Continues Lisinopril  10 MG daily.  Hypertension status: stable  Satisfied with current treatment? yes Duration of hypertension: chronic BP monitoring frequency:  not checking BP range:  BP medication side effects:  no Medication compliance: good compliance Aspirin: no Recurrent headaches: no Visual changes: no Palpitations: no Dyspnea: no Chest pain: no Lower extremity edema: occasional at end of day Dizzy/lightheaded: no  The 10-year ASCVD risk score (Arnett DK, et al., 2019) is: 3%   Values used to calculate the score:     Age: 32 years     Clincally relevant sex: Male     Is Non-Hispanic African American: No     Diabetic: Yes      Tobacco smoker: No     Systolic Blood Pressure: 117 mmHg     Is BP treated: Yes     HDL Cholesterol: 32 mg/dL     Total Cholesterol: 153 mg/dL      89/89/7974    5:98 PM 09/25/2023    8:53 AM 06/13/2023    8:26 AM 03/14/2023    8:51 AM 12/12/2022    9:54 AM  Depression screen PHQ 2/9  Decreased Interest 0 0 0 0 0  Down, Depressed, Hopeless 0 0 0 0 0  PHQ - 2 Score 0 0 0 0 0  Altered sleeping 0 0 0 0 0  Tired, decreased energy 0 1 0 1 3  Change in appetite 0 1 3 0 1  Feeling bad or failure about yourself  0 0 0 0 0  Trouble concentrating 0 0 0 0 0  Moving slowly or fidgety/restless 0 0 0 0 0  Suicidal thoughts 0 0 0 0 0  PHQ-9 Score 0 2 3 1 4   Difficult doing work/chores Not difficult at all Not difficult at all Not difficult at all Not difficult at all Somewhat difficult       12/27/2023    4:01 PM 09/25/2023    8:53 AM 06/13/2023    8:25 AM 03/14/2023    8:52 AM  GAD 7 : Generalized Anxiety Score  Nervous, Anxious, on Edge 0 0 0 0  Control/stop worrying 0 0 0 0  Worry too much - different things 0 0 0 0  Trouble relaxing 0 0 0 0  Restless 0 0 0 0  Easily annoyed or irritable 0 0 0 0  Afraid - awful might happen 0 0 0 0  Total GAD 7 Score 0 0 0 0  Anxiety Difficulty Not difficult at all Not difficult at all Not difficult at all Not difficult at all     Relevant past medical, surgical, family and social history reviewed and updated as indicated. Interim medical history since our last visit reviewed. Allergies and medications reviewed and updated.  Review of Systems  Constitutional:  Negative for activity change, diaphoresis, fatigue and fever.  Respiratory:  Negative for cough, chest tightness, shortness of breath and wheezing.   Cardiovascular:  Negative for chest pain, palpitations and leg swelling.  Gastrointestinal: Negative.   Endocrine: Negative for polydipsia, polyphagia and polyuria.  Neurological: Negative.   Psychiatric/Behavioral: Negative.     Per HPI  unless specifically indicated above     Objective:    BP 117/79   Pulse 73   Temp 98.7 F (37.1 C) (Oral)   Ht 6' 1.2 (1.859 m)   Wt (!) 345 lb 12.8 oz (156.9 kg)   SpO2 94%   BMI 45.37 kg/m   Wt Readings from Last 3 Encounters:  12/27/23 (!) 345 lb 12.8 oz (156.9 kg)  11/17/23 (!) 359 lb 5.6 oz (163 kg)  09/25/23 (!) 359 lb 6.4 oz (163 kg)    Physical Exam Vitals and nursing note reviewed.  Constitutional:      General: He is awake. He is not in acute distress.    Appearance: Normal appearance. He is well-developed and well-groomed. He is obese. He is not ill-appearing or toxic-appearing.  HENT:     Head: Normocephalic and atraumatic.     Right Ear: Hearing normal. No drainage.     Left Ear: Hearing normal. No drainage.  Eyes:     General: Lids are normal.        Right eye: No discharge.        Left eye: No discharge.     Conjunctiva/sclera: Conjunctivae normal.     Pupils: Pupils are equal, round, and reactive to light.  Neck:     Vascular: No carotid bruit.  Cardiovascular:     Rate and Rhythm: Normal rate and regular rhythm.     Heart sounds: Normal heart sounds, S1 normal and S2 normal. No murmur heard.    No gallop.  Pulmonary:     Effort: Pulmonary effort is normal. No accessory muscle usage or respiratory distress.     Breath sounds: Normal breath sounds.  Abdominal:     General: Bowel sounds are normal. There is no distension.     Palpations: Abdomen is soft.     Tenderness: There is no abdominal tenderness.  Musculoskeletal:        General: Normal range of motion.     Cervical back: Normal range of motion and neck supple.     Right lower leg: No edema.     Left lower leg: No edema.     Right ankle: No swelling. No tenderness. Normal range of motion. Normal pulse.     Right Achilles Tendon: Normal.     Left ankle: No swelling. No tenderness. Normal range of motion. Normal pulse.     Left Achilles Tendon: Normal.  Skin:    General: Skin is warm and  dry.  Neurological:     Mental Status: He is alert and oriented to person, place, and time.     Deep Tendon Reflexes:     Reflex Scores:      Brachioradialis reflexes are 2+ on the right side and 2+ on the left side.      Patellar reflexes are 2+ on the right side and 2+ on the left side. Psychiatric:        Attention and Perception: Attention normal.        Mood and Affect: Mood normal.        Speech: Speech normal.        Behavior: Behavior normal. Behavior is cooperative.        Thought Content: Thought content normal.     Results for orders placed or performed in visit on 12/27/23  Bayer DCA Hb A1c Waived   Collection Time: 12/27/23  3:46 PM  Result Value Ref Range   HB A1C (BAYER DCA - WAIVED) 5.3 4.8 - 5.6 %      Assessment & Plan:   Problem List Items Addressed This Visit       Cardiovascular and Mediastinum   Hypertension associated with diabetes (HCC)   Chronic, stable.  BP at goal in office today.  Continue Lisinopril  at 10 MG, maintain it for kidney protection with proteinuria.  Urine ALB 150 March 2025, maintain ACE.  Recommend continued focus on diet and weight loss at home.  LABS: CMP.        Relevant Medications   rosuvastatin (CRESTOR) 10 MG tablet   tirzepatide  (MOUNJARO ) 15 MG/0.5ML Pen   Other Relevant Orders   Bayer DCA Hb A1c Waived (Completed)   Comprehensive metabolic panel with GFR     Endocrine   Type 2 diabetes mellitus in patient with obesity (HCC) - Primary   Chronic, ongoing. Initial diagnosis September 2020 with A1c 6.9%.  A1c 5.3% today, remaining stable while on Mounjaro  and has lost almost 14 lbs.  Urine ALB 150 in March 2025, maintain ACE on board.  Continue to work with Channing PharmD with [facility]. Increase Mounjaro  to 15 MG weekly. - Is on ACE for kidney protection, continue this.  No current statin, refuses. - Eye and foot exams up to date. - Refuses vaccinations - Continue to monitor BS at home occasionally and focus on diet and weight  loss.   Return to office in 3 months.      Relevant Medications   rosuvastatin (CRESTOR) 10 MG tablet   tirzepatide  (MOUNJARO ) 15 MG/0.5ML Pen   Other Relevant Orders   Bayer DCA Hb A1c Waived (Completed)   Hyperlipidemia associated with type 2 diabetes mellitus (HCC)   Chronic, ongoing. Discussed at length diabetes and statin use. He agrees to trial of statin. Start Rosuvastatin 10 MG daily, educated him on this and side effects to report to provider. Lipid panel today.      Relevant Medications   rosuvastatin (CRESTOR) 10 MG tablet   tirzepatide  (MOUNJARO ) 15 MG/0.5ML Pen   Other Relevant Orders   Bayer DCA Hb A1c Waived (Completed)   Comprehensive metabolic panel with GFR   Lipid Panel w/o Chol/HDL Ratio   Diabetes mellitus treated with injections of non-insulin medication (HCC)   Refer to diabetes with obesity plan of care.      Relevant Medications   rosuvastatin (CRESTOR) 10 MG tablet   tirzepatide  (MOUNJARO ) 15 MG/0.5ML Pen   Other Relevant Orders   Bayer DCA Hb A1c Waived (Completed)  Other   Morbid obesity (HCC)   BMI 45.37 with T2DM and HTN.  14 lbs lost on Mounjaro .  Recommended eating smaller high protein, low fat meals more frequently and exercising 30 mins a day 5 times a week with a goal of 10-15lb weight loss in the next 3 months. Patient voiced their understanding and motivation to adhere to these recommendations.         Relevant Medications   tirzepatide  (MOUNJARO ) 15 MG/0.5ML Pen   BMI 45.0-49.9, adult (HCC)   Refer to morbid obesity plan of care.       Relevant Medications   tirzepatide  (MOUNJARO ) 15 MG/0.5ML Pen     Follow up plan: Return in about 3 months (around 03/28/2024) for T2DM, HTN/HLD.

## 2023-12-27 NOTE — Assessment & Plan Note (Signed)
 BMI 45.37 with T2DM and HTN.  14 lbs lost on Mounjaro .  Recommended eating smaller high protein, low fat meals more frequently and exercising 30 mins a day 5 times a week with a goal of 10-15lb weight loss in the next 3 months. Patient voiced their understanding and motivation to adhere to these recommendations.

## 2023-12-27 NOTE — Assessment & Plan Note (Signed)
 Refer to morbid obesity plan of care.

## 2023-12-27 NOTE — Assessment & Plan Note (Signed)
 Chronic, ongoing. Initial diagnosis September 2020 with A1c 6.9%.  A1c 5.3% today, remaining stable while on Mounjaro  and has lost almost 14 lbs.  Urine ALB 150 in March 2025, maintain ACE on board.  Continue to work with Channing PharmD with [facility]. Increase Mounjaro  to 15 MG weekly. - Is on ACE for kidney protection, continue this.  No current statin, refuses. - Eye and foot exams up to date. - Refuses vaccinations - Continue to monitor BS at home occasionally and focus on diet and weight loss.   Return to office in 3 months.

## 2023-12-27 NOTE — Assessment & Plan Note (Signed)
 Chronic, stable.  BP at goal in office today.  Continue Lisinopril  at 10 MG, maintain it for kidney protection with proteinuria.  Urine ALB 150 March 2025, maintain ACE.  Recommend continued focus on diet and weight loss at home.  LABS: CMP.

## 2023-12-27 NOTE — Assessment & Plan Note (Addendum)
 Chronic, ongoing. Discussed at length diabetes and statin use. He agrees to trial of statin. Start Rosuvastatin 10 MG daily, educated him on this and side effects to report to provider. Lipid panel today.

## 2023-12-27 NOTE — Assessment & Plan Note (Signed)
 Refer to diabetes with obesity plan of care.

## 2023-12-28 ENCOUNTER — Ambulatory Visit: Payer: Self-pay | Admitting: Nurse Practitioner

## 2023-12-28 LAB — COMPREHENSIVE METABOLIC PANEL WITH GFR
ALT: 26 IU/L (ref 0–44)
AST: 20 IU/L (ref 0–40)
Albumin: 4.5 g/dL (ref 4.1–5.1)
Alkaline Phosphatase: 93 IU/L (ref 47–123)
BUN/Creatinine Ratio: 11 (ref 9–20)
BUN: 12 mg/dL (ref 6–24)
Bilirubin Total: 0.5 mg/dL (ref 0.0–1.2)
CO2: 24 mmol/L (ref 20–29)
Calcium: 9.4 mg/dL (ref 8.7–10.2)
Chloride: 103 mmol/L (ref 96–106)
Creatinine, Ser: 1.07 mg/dL (ref 0.76–1.27)
Globulin, Total: 2.5 g/dL (ref 1.5–4.5)
Glucose: 76 mg/dL (ref 70–99)
Potassium: 3.8 mmol/L (ref 3.5–5.2)
Sodium: 142 mmol/L (ref 134–144)
Total Protein: 7 g/dL (ref 6.0–8.5)
eGFR: 89 mL/min/1.73 (ref >59)

## 2023-12-28 LAB — LIPID PANEL W/O CHOL/HDL RATIO
Cholesterol, Total: 162 mg/dL (ref 100–199)
HDL: 37 mg/dL — ABNORMAL LOW (ref >39)
LDL Chol Calc (NIH): 90 mg/dL (ref 0–99)
Triglycerides: 203 mg/dL — ABNORMAL HIGH (ref 0–149)
VLDL Cholesterol Cal: 35 mg/dL (ref 5–40)

## 2024-03-28 NOTE — Patient Instructions (Signed)
 Be Involved in Caring For Your Health:  Taking Medications When medications are taken as directed, they can greatly improve your health. But if they are not taken as prescribed, they may not work. In some cases, not taking them correctly can be harmful. To help ensure your treatment remains effective and safe, understand your medications and how to take them. Bring your medications to each visit for review by your provider.  Your lab results, notes, and after visit summary will be available on My Chart. We strongly encourage you to use this feature. If lab results are abnormal the clinic will contact you with the appropriate steps. If the clinic does not contact you assume the results are satisfactory. You can always view your results on My Chart. If you have questions regarding your health or results, please contact the clinic during office hours. You can also ask questions on My Chart.  We at [facility] are grateful that you chose [place] to provide your care. We strive to provide evidence-based and compassionate care and are always looking for feedback. If you get a survey from the clinic please complete this so we can hear your opinions.  Diabetes Mellitus and Foot Care Diabetes, also called diabetes mellitus, may cause problems with your feet and legs because of poor blood flow (circulation). Poor circulation may make your skin: Become thinner and drier. Break more easily. Heal more slowly. Peel and crack. You may also have nerve damage (neuropathy). This can cause decreased feeling in your legs and feet. This means that you may not notice minor injuries to your feet that could lead to more serious problems. Finding and treating problems early is the best way to prevent future foot problems. How to care for your feet Foot hygiene  Wash your feet daily with warm water and mild soap. Do not use hot water. Then, pat your feet and the areas between your toes until they are fully dry. Do  not soak your feet. This can dry your skin. Trim your toenails straight across. Do not dig under them or around the cuticle. File the edges of your nails with an emery board or nail file. Apply a moisturizing lotion or petroleum jelly to the skin on your feet and to dry, brittle toenails. Use lotion that does not contain alcohol and is unscented. Do not apply lotion between your toes. Shoes and socks Wear clean socks or stockings every day. Make sure they are not too tight. Do not wear knee-high stockings. These may decrease blood flow to your legs. Wear shoes that fit well and have enough cushioning. Always look in your shoes before you put them on to be sure there are no objects inside. To break in new shoes, wear them for just a few hours a day. This prevents injuries on your feet. Wounds, scrapes, corns, and calluses  Check your feet daily for blisters, cuts, bruises, sores, and redness. If you cannot see the bottom of your feet, use a mirror or ask someone for help. Do not cut off corns or calluses or try to remove them with medicine. If you find a minor scrape, cut, or break in the skin on your feet, keep it and the skin around it clean and dry. You may clean these areas with mild soap and water. Do not clean the area with peroxide, alcohol, or iodine. If you have a wound, scrape, corn, or callus on your foot, look at it several times a day to make sure it  is healing and not infected. Check for: Redness, swelling, or pain. Fluid or blood. Warmth. Pus or a bad smell. General tips Do not cross your legs. This may decrease blood flow to your feet. Do not use heating pads or hot water bottles on your feet. They may burn your skin. If you have lost feeling in your feet or legs, you may not know this is happening until it is too late. Protect your feet from hot and cold by wearing shoes, such as at the beach or on hot pavement. Schedule a complete foot exam at least once a year or more often if  you have foot problems. Report any cuts, sores, or bruises to your health care provider right away. Where to find more information American Diabetes Association: diabetes.org Association of Diabetes Care & Education Specialists: diabeteseducator.org Contact a health care provider if: You have a condition that increases your risk of infection, and you have any cuts, sores, or bruises on your feet. You have an injury that is not healing. You have redness on your legs or feet. You feel burning or tingling in your legs or feet. You have pain or cramps in your legs and feet. Your legs or feet are numb. Your feet always feel cold. You have pain around any toenails. Get help right away if: You have a wound, scrape, corn, or callus on your foot and: You have signs of infection. You have a fever. You have a red line going up your leg. This information is not intended to replace advice given to you by your health care provider. Make sure you discuss any questions you have with your health care provider. Document Revised: 09/06/2021 Document Reviewed: 09/06/2021 Elsevier Patient Education  2024 [employer].

## 2024-03-30 ENCOUNTER — Encounter: Payer: Self-pay | Admitting: Nurse Practitioner

## 2024-03-30 ENCOUNTER — Ambulatory Visit: Admitting: Nurse Practitioner

## 2024-03-30 VITALS — BP 125/80 | HR 66 | Temp 97.4°F | Resp 18 | Ht 73.19 in | Wt 355.0 lb

## 2024-03-30 DIAGNOSIS — E119 Type 2 diabetes mellitus without complications: Secondary | ICD-10-CM | POA: Diagnosis not present

## 2024-03-30 DIAGNOSIS — I152 Hypertension secondary to endocrine disorders: Secondary | ICD-10-CM

## 2024-03-30 DIAGNOSIS — Z7985 Long-term (current) use of injectable non-insulin antidiabetic drugs: Secondary | ICD-10-CM | POA: Diagnosis not present

## 2024-03-30 DIAGNOSIS — E785 Hyperlipidemia, unspecified: Secondary | ICD-10-CM | POA: Diagnosis not present

## 2024-03-30 DIAGNOSIS — E1169 Type 2 diabetes mellitus with other specified complication: Secondary | ICD-10-CM | POA: Diagnosis not present

## 2024-03-30 DIAGNOSIS — E669 Obesity, unspecified: Secondary | ICD-10-CM

## 2024-03-30 DIAGNOSIS — Z6841 Body Mass Index (BMI) 40.0 and over, adult: Secondary | ICD-10-CM | POA: Diagnosis not present

## 2024-03-30 DIAGNOSIS — E1159 Type 2 diabetes mellitus with other circulatory complications: Secondary | ICD-10-CM | POA: Diagnosis not present

## 2024-03-30 LAB — MICROALBUMIN, URINE WAIVED
Creatinine, Urine Waived: 200 mg/dL (ref 10–300)
Microalb, Ur Waived: 80 mg/L — ABNORMAL HIGH (ref 0–19)

## 2024-03-30 LAB — BAYER DCA HB A1C WAIVED: HB A1C (BAYER DCA - WAIVED): 5.3 % (ref 4.8–5.6)

## 2024-03-30 NOTE — Assessment & Plan Note (Signed)
 Refer to diabetes with obesity plan of care.

## 2024-03-30 NOTE — Assessment & Plan Note (Signed)
 Chronic, stable.  BP at goal in office today.  Continue Lisinopril  at 10 MG, maintain it for kidney protection with proteinuria.  Urine ALB 80 January 2026, maintain ACE.  Recommend continued focus on diet and weight loss at home.  LABS: CMP.

## 2024-03-30 NOTE — Assessment & Plan Note (Addendum)
 Chronic, ongoing. Initial diagnosis September 2020 with A1c 6.9%.  A1c 5.3% today, remaining stable while on Mounjaro , but has gained some weight back over the holidays.  Urine ALB 80 January 2026, maintain ACE on board.  Continue to work with Channing PharmD with [facility]. Continue Mounjaro  15 MG weekly at this time, but if ongoing weight gain next visit may change to less expensive OOP alternative. - Is on ACE for kidney protection, continue this, and statin therapy on board. - Eye and foot exams up to date. - Refuses vaccinations - Continue to monitor BS at home occasionally and focus on diet and weight loss.   Return to office in 3 months.

## 2024-03-30 NOTE — Progress Notes (Signed)
 "  BP 125/80 (BP Location: Left Arm, Patient Position: Sitting, Cuff Size: Large)   Pulse 66   Temp (!) 97.4 F (36.3 C) (Oral)   Resp 18   Ht 6' 1.19 (1.859 m)   Wt (!) 355 lb (161 kg)   SpO2 93%   BMI 46.60 kg/m    Subjective:    Patient ID: Brian Thornton, male    DOB: August 18, 1980, 44 y.o.   MRN: [id number]  HPI: Brian Thornton is a 44 y.o. male  Chief Complaint  Patient presents with   Medication Problem    Did not take mounjaro  last Thursday, it was $80 was not sure if the medication would be changed or not.   Follow-up    Here for follow up (3 month check up)   DIABETES October A1c 5.3%. Continues on Mounjaro  15 MG weekly, initially lost weight with this but has gained 10 lbs since last visit. Feels like appetite has remained reduced, but some plateau of this.   Hypoglycemic episodes:no Polydipsia/polyuria: no  Visual disturbance: no Chest pain: no Paresthesias: no Glucose Monitoring: no  Accucheck frequency: Not Checking  Fasting glucose:  Post prandial:  Evening:  Before meals: Taking Insulin?: no  Long acting insulin:  Short acting insulin: Blood Pressure Monitoring: not checking Retinal Examination: Up To Date -- [place] [facility] Exam: Up to Date Pneumovax: refused Influenza: refused Aspirin: no   HYPERTENSION  Taking Lisinopril  10 MG daily and Rosuvastatin  10 MG daily. Hypertension status: stable  Satisfied with current treatment? yes Duration of hypertension: chronic BP monitoring frequency:  not checking BP range:  BP medication side effects:  no Medication compliance: good compliance Aspirin: no Recurrent headaches: no Visual changes: no Palpitations: no Dyspnea: no Chest pain: no Lower extremity edema: occasional near end of day when on feet Dizzy/lightheaded: no  The 10-year ASCVD risk score (Arnett DK, et al., 2019) is: 3.1%   Values used to calculate the score:     Age: 23 years     Clinically relevant sex: Male     Is  Non-Hispanic African American: No     Diabetic: Yes     Tobacco smoker: No     Systolic Blood Pressure: 125 mmHg     Is BP treated: Yes     HDL Cholesterol: 37 mg/dL     Total Cholesterol: 162 mg/dL      8/87/7973    6:44 PM 12/27/2023    4:01 PM 09/25/2023    8:53 AM 06/13/2023    8:26 AM 03/14/2023    8:51 AM  Depression screen PHQ 2/9  Decreased Interest 0 0 0 0 0  Down, Depressed, Hopeless 0 0 0 0 0  PHQ - 2 Score 0 0 0 0 0  Altered sleeping 0 0 0 0 0  Tired, decreased energy 0 0 1 0 1  Change in appetite 0 0 1 3 0  Feeling bad or failure about yourself  0 0 0 0 0  Trouble concentrating 0 0 0 0 0  Moving slowly or fidgety/restless 0 0 0 0 0  Suicidal thoughts 0 0 0 0 0  PHQ-9 Score 0 0  2  3  1    Difficult doing work/chores Not difficult at all Not difficult at all Not difficult at all Not difficult at all Not difficult at all     Data saved with a previous flowsheet row definition       03/30/2024    3:55 PM  12/27/2023    4:01 PM 09/25/2023    8:53 AM 06/13/2023    8:25 AM  GAD 7 : Generalized Anxiety Score  Nervous, Anxious, on Edge 0 0 0 0  Control/stop worrying 0 0 0 0  Worry too much - different things 0 0 0 0  Trouble relaxing 0 0 0 0  Restless 0 0 0 0  Easily annoyed or irritable 0 0 0 0  Afraid - awful might happen 0 0 0 0  Total GAD 7 Score 0 0 0 0  Anxiety Difficulty Not difficult at all Not difficult at all Not difficult at all Not difficult at all   Relevant past medical, surgical, family and social history reviewed and updated as indicated. Interim medical history since our last visit reviewed. Allergies and medications reviewed and updated.  Review of Systems  Constitutional:  Negative for activity change, diaphoresis, fatigue and fever.  Respiratory:  Negative for cough, chest tightness, shortness of breath and wheezing.   Cardiovascular:  Negative for chest pain, palpitations and leg swelling.  Gastrointestinal: Negative.   Endocrine: Negative for  polydipsia, polyphagia and polyuria.  Neurological: Negative.   Psychiatric/Behavioral: Negative.     Per HPI unless specifically indicated above     Objective:    BP 125/80 (BP Location: Left Arm, Patient Position: Sitting, Cuff Size: Large)   Pulse 66   Temp (!) 97.4 F (36.3 C) (Oral)   Resp 18   Ht 6' 1.19 (1.859 m)   Wt (!) 355 lb (161 kg)   SpO2 93%   BMI 46.60 kg/m   Wt Readings from Last 3 Encounters:  03/30/24 (!) 355 lb (161 kg)  12/27/23 (!) 345 lb 12.8 oz (156.9 kg)  11/17/23 (!) 359 lb 5.6 oz (163 kg)    Physical Exam Vitals and nursing note reviewed.  Constitutional:      General: He is awake. He is not in acute distress.    Appearance: Normal appearance. He is well-developed and well-groomed. He is obese. He is not ill-appearing or toxic-appearing.  HENT:     Head: Normocephalic and atraumatic.     Right Ear: Hearing normal. No drainage.     Left Ear: Hearing normal. No drainage.  Eyes:     General: Lids are normal.        Right eye: No discharge.        Left eye: No discharge.     Conjunctiva/sclera: Conjunctivae normal.     Pupils: Pupils are equal, round, and reactive to light.  Neck:     Vascular: No carotid bruit.  Cardiovascular:     Rate and Rhythm: Normal rate and regular rhythm.     Heart sounds: Normal heart sounds, S1 normal and S2 normal. No murmur heard.    No gallop.  Pulmonary:     Effort: Pulmonary effort is normal. No accessory muscle usage or respiratory distress.     Breath sounds: Normal breath sounds.  Abdominal:     General: Bowel sounds are normal. There is no distension.     Palpations: Abdomen is soft.     Tenderness: There is no abdominal tenderness.  Musculoskeletal:        General: Normal range of motion.     Cervical back: Normal range of motion and neck supple.     Right lower leg: No edema.     Left lower leg: No edema.     Right ankle: No swelling. No tenderness. Normal range of motion. Normal  pulse.     Right  Achilles Tendon: Normal.     Left ankle: No swelling. No tenderness. Normal range of motion. Normal pulse.     Left Achilles Tendon: Normal.  Skin:    General: Skin is warm and dry.  Neurological:     Mental Status: He is alert and oriented to person, place, and time.     Deep Tendon Reflexes:     Reflex Scores:      Brachioradialis reflexes are 2+ on the right side and 2+ on the left side.      Patellar reflexes are 2+ on the right side and 2+ on the left side. Psychiatric:        Attention and Perception: Attention normal.        Mood and Affect: Mood normal.        Speech: Speech normal.        Behavior: Behavior normal. Behavior is cooperative.        Thought Content: Thought content normal.    Results for orders placed or performed in visit on 12/27/23  Bayer DCA Hb A1c Waived   Collection Time: 12/27/23  3:46 PM  Result Value Ref Range   HB A1C (BAYER DCA - WAIVED) 5.3 4.8 - 5.6 %  Comprehensive metabolic panel with GFR   Collection Time: 12/27/23  3:46 PM  Result Value Ref Range   Glucose 76 70 - 99 mg/dL   BUN 12 6 - 24 mg/dL   Creatinine, Ser 8.92 0.76 - 1.27 mg/dL   eGFR 89 >40 fO/fpw/8.26   BUN/Creatinine Ratio 11 9 - 20   Sodium 142 134 - 144 mmol/L   Potassium 3.8 3.5 - 5.2 mmol/L   Chloride 103 96 - 106 mmol/L   CO2 24 20 - 29 mmol/L   Calcium  9.4 8.7 - 10.2 mg/dL   Total Protein 7.0 6.0 - 8.5 g/dL   Albumin 4.5 4.1 - 5.1 g/dL   Globulin, Total 2.5 1.5 - 4.5 g/dL   Bilirubin Total 0.5 0.0 - 1.2 mg/dL   Alkaline Phosphatase 93 47 - 123 IU/L   AST 20 0 - 40 IU/L   ALT 26 0 - 44 IU/L  Lipid Panel w/o Chol/HDL Ratio   Collection Time: 12/27/23  3:46 PM  Result Value Ref Range   Cholesterol, Total 162 100 - 199 mg/dL   Triglycerides 796 (H) 0 - 149 mg/dL   HDL 37 (L) >60 mg/dL   VLDL Cholesterol Cal 35 5 - 40 mg/dL   LDL Chol Calc (NIH) 90 0 - 99 mg/dL      Assessment & Plan:   Problem List Items Addressed This Visit       Cardiovascular and  Mediastinum   Hypertension associated with diabetes (HCC)   Chronic, stable.  BP at goal in office today.  Continue Lisinopril  at 10 MG, maintain it for kidney protection with proteinuria.  Urine ALB 80 January 2026, maintain ACE.  Recommend continued focus on diet and weight loss at home.  LABS: CMP.        Relevant Orders   Bayer DCA Hb A1c Waived   Microalbumin, Urine Waived     Endocrine   Type 2 diabetes mellitus in patient with obesity (HCC) - Primary   Chronic, ongoing. Initial diagnosis September 2020 with A1c 6.9%.  A1c 5.3% today, remaining stable while on Mounjaro , but has gained some weight back over the holidays.  Urine ALB 80 January 2026, maintain ACE on board.  Continue to work with [facility] with [facility]. Continue Mounjaro  15 MG weekly at this time, but if ongoing weight gain next visit may change to less expensive OOP alternative. - Is on ACE for kidney protection, continue this, and statin therapy on board. - Eye and foot exams up to date. - Refuses vaccinations - Continue to monitor BS at home occasionally and focus on diet and weight loss.   Return to office in 3 months.      Relevant Orders   Bayer DCA Hb A1c Waived   Hyperlipidemia associated with type 2 diabetes mellitus (HCC)   Chronic, ongoing. Continue Rosuvastatin  10 MG daily, educated him on this and side effects to report to provider. Lipid panel today.      Relevant Orders   Bayer DCA Hb A1c Waived   Comprehensive metabolic panel with GFR   Lipid Panel w/o Chol/HDL Ratio   Diabetes mellitus treated with injections of non-insulin medication (HCC)   Refer to diabetes with obesity plan of care.      Relevant Orders   Bayer DCA Hb A1c Waived     Other   Morbid obesity (HCC)   BMI 46.60 with T2DM and HTN.  Some gain since previous visit.  Recommended eating smaller high protein, low fat meals more frequently and exercising 30 mins a day 5 times a week with a goal of 10-15lb weight loss in the next 3  months. Patient voiced their understanding and motivation to adhere to these recommendations.         BMI 45.0-49.9, adult [facility])   Refer to morbid obesity plan of care.         Follow up plan: Return in about 3 months (around 06/28/2024) for T2DM, HTN.      "

## 2024-03-30 NOTE — Assessment & Plan Note (Signed)
 BMI 46.60 with T2DM and HTN.  Some gain since previous visit.  Recommended eating smaller high protein, low fat meals more frequently and exercising 30 mins a day 5 times a week with a goal of 10-15lb weight loss in the next 3 months. Patient voiced their understanding and motivation to adhere to these recommendations.

## 2024-03-30 NOTE — Assessment & Plan Note (Signed)
 Chronic, ongoing. Continue Rosuvastatin  10 MG daily, educated him on this and side effects to report to provider. Lipid panel today.

## 2024-03-30 NOTE — Assessment & Plan Note (Signed)
 Refer to morbid obesity plan of care.

## 2024-03-31 ENCOUNTER — Ambulatory Visit: Payer: Self-pay | Admitting: Nurse Practitioner

## 2024-03-31 LAB — COMPREHENSIVE METABOLIC PANEL WITH GFR
ALT: 30 IU/L (ref 0–44)
AST: 23 IU/L (ref 0–40)
Albumin: 4.6 g/dL (ref 4.1–5.1)
Alkaline Phosphatase: 87 IU/L (ref 47–123)
BUN/Creatinine Ratio: 11 (ref 9–20)
BUN: 13 mg/dL (ref 6–24)
Bilirubin Total: 0.5 mg/dL (ref 0.0–1.2)
CO2: 22 mmol/L (ref 20–29)
Calcium: 9.5 mg/dL (ref 8.7–10.2)
Chloride: 102 mmol/L (ref 96–106)
Creatinine, Ser: 1.14 mg/dL (ref 0.76–1.27)
Globulin, Total: 2.6 g/dL (ref 1.5–4.5)
Glucose: 85 mg/dL (ref 70–99)
Potassium: 4.1 mmol/L (ref 3.5–5.2)
Sodium: 142 mmol/L (ref 134–144)
Total Protein: 7.2 g/dL (ref 6.0–8.5)
eGFR: 82 mL/min/1.73

## 2024-03-31 LAB — LIPID PANEL W/O CHOL/HDL RATIO
Cholesterol, Total: 129 mg/dL (ref 100–199)
HDL: 35 mg/dL — ABNORMAL LOW
LDL Chol Calc (NIH): 64 mg/dL (ref 0–99)
Triglycerides: 175 mg/dL — ABNORMAL HIGH (ref 0–149)
VLDL Cholesterol Cal: 30 mg/dL (ref 5–40)

## 2024-03-31 NOTE — Progress Notes (Signed)
 Contacted via MyChart  Good morning Brian Thornton, your labs have returned and overall remain stable: - Kidney function, creatinine and eGFR, remains normal, as is liver function, AST and ALT.  - Lipid panel shows LDL trending down and at goal with statin on board, continue this as ordered. Triglycerides remain a little elevate, focus on healthy diet changes. Any questions? Keep being amazing!!  Thank you for allowing me to participate in your care.  I appreciate you. Kindest regards, Tayona Sarnowski

## 2024-04-29 ENCOUNTER — Encounter: Admitting: Nurse Practitioner

## 2024-06-30 ENCOUNTER — Ambulatory Visit: Admitting: Nurse Practitioner
